# Patient Record
Sex: Female | Born: 1965 | Race: Black or African American | Hispanic: No | Smoking: Never smoker
Health system: Southern US, Community
[De-identification: ages and names within clinical notes are randomized; demographics above are authoritative.]

## PROBLEM LIST (undated history)

## (undated) DIAGNOSIS — E119 Type 2 diabetes mellitus without complications: Secondary | ICD-10-CM

## (undated) DIAGNOSIS — J45909 Unspecified asthma, uncomplicated: Secondary | ICD-10-CM

## (undated) DIAGNOSIS — M797 Fibromyalgia: Secondary | ICD-10-CM

## (undated) DIAGNOSIS — I82403 Acute embolism and thrombosis of unspecified deep veins of lower extremity, bilateral: Secondary | ICD-10-CM

## (undated) DIAGNOSIS — G4730 Sleep apnea, unspecified: Secondary | ICD-10-CM

## (undated) DIAGNOSIS — M1990 Unspecified osteoarthritis, unspecified site: Secondary | ICD-10-CM

## (undated) HISTORY — PX: ABDOMINAL HYSTERECTOMY: SHX81

## (undated) HISTORY — PX: FOOT SURGERY: SHX648

---

## 2005-11-13 ENCOUNTER — Ambulatory Visit: Payer: Self-pay | Admitting: Pulmonary Disease

## 2006-02-26 ENCOUNTER — Ambulatory Visit: Payer: Self-pay | Admitting: Pulmonary Disease

## 2006-03-20 ENCOUNTER — Ambulatory Visit: Payer: Self-pay | Admitting: Pulmonary Disease

## 2007-08-18 ENCOUNTER — Emergency Department (HOSPITAL_COMMUNITY): Admission: EM | Admit: 2007-08-18 | Discharge: 2007-08-18 | Payer: Self-pay | Admitting: Emergency Medicine

## 2008-10-29 ENCOUNTER — Emergency Department (HOSPITAL_COMMUNITY): Admission: EM | Admit: 2008-10-29 | Discharge: 2008-10-30 | Payer: Self-pay | Admitting: Emergency Medicine

## 2008-12-25 ENCOUNTER — Emergency Department (HOSPITAL_COMMUNITY): Admission: EM | Admit: 2008-12-25 | Discharge: 2008-12-25 | Payer: Self-pay | Admitting: Emergency Medicine

## 2009-03-28 ENCOUNTER — Emergency Department (HOSPITAL_COMMUNITY): Admission: EM | Admit: 2009-03-28 | Discharge: 2009-03-28 | Payer: Self-pay | Admitting: Emergency Medicine

## 2009-04-07 ENCOUNTER — Emergency Department (HOSPITAL_COMMUNITY): Admission: EM | Admit: 2009-04-07 | Discharge: 2009-04-07 | Payer: Self-pay | Admitting: Emergency Medicine

## 2010-06-02 LAB — POCT URINALYSIS DIP (DEVICE)
Bilirubin Urine: NEGATIVE
Glucose, UA: NEGATIVE mg/dL
Ketones, ur: NEGATIVE mg/dL
Nitrite: NEGATIVE

## 2010-07-15 NOTE — Letter (Signed)
December 07, 2005    Stephanie Camacho  [address]  [place], [place] [address]   RE:  [name], [name]  MRN:  [id number]  /  DOB:  1966-01-08   Dear [name],   You were scheduled to have a followup appointment at [facility] on  December 07, 2005.  Unfortunately, you were not able to make this visit.  If  you would like for [place] to continue to assist you in the care of your sleep  difficulties as well as your breathing difficulties, please call our office  to reschedule your followup appointment.   Sincerely,      Coralyn Helling, MD    VS/MedQ  /  Job #:  [id number]  DD:  12/07/2005 / DT:  12/09/2005

## 2010-07-15 NOTE — Assessment & Plan Note (Signed)
[facility]                             PULMONARY OFFICE NOTE   Stephanie Camacho, Stephanie Camacho                      MRN:          [id number]  DATE:06/04/2006                            DOB:          1965-12-10    PULMONARY NOTE   I had received the apnea report for Stephanie Camacho, which was done on June 01, 2006, and this showed a respiratory index of 6, which would be  suggestive of some degree of sleep-disordered breathing.   I had discussed these results with Stephanie Camacho, and I discussed with her  that, this, in connection with her symptoms description, still raises  the concern for her having some obstructive sleep apnea.  I have again  suggested that she undergo an overnight polysomnogram to further  evaluate this.  She is agreeable to do this at this time as she has  apparently had a change in her insurance, and hopefully, she will not  have the same kind of financial constraints that was previously  experienced with scheduling her sleep study.  I will then plan on  calling her after review of her sleep study to discuss the results and  any further treatment or recommendations.     Coralyn Helling, MD  Electronically Signed    VS/MedQ  DD: 06/06/2006  DT: 06/06/2006  Job #: [id number]

## 2010-07-15 NOTE — Letter (Signed)
January 08, 2006     RE:  TARAYA, STEWARD  MRN:  [id number]  /  DOB:  1966-02-07   Dear [name],   You were scheduled to have a followup appointment at [facility] on  January 08, 2006 for your history of narcolepsy as well as asthma.  Unfortunately, you were not able to make this appointment. If you would like  [place] to continue assisting you with your sleeping and breathing difficulties,  please call our office to reschedule a followup appointment.   Sincerely,     Coralyn Helling, M.D.    Sincerely,      Coralyn Helling, MD  Electronically Signed    VS/MedQ  DD: 01/08/2006  DT: 01/08/2006  Job #: [id number]

## 2010-07-15 NOTE — Assessment & Plan Note (Signed)
[facility]                             PULMONARY OFFICE NOTE   [name], [name]                      MRN:          [id number]  DATE:03/20/2006                            DOB:          1965/12/07    I saw Stephanie Camacho today in followup for her asthma, narcolepsy, sleep  disturbance, and chronic rhinitis with postnasal drip. She apparently  was having difficulties with migraines and as we saw was not able to  have earlier followup. She says that she had an MRI and was told that  she had a cyst, but this was a benign lesion. She currently complains of  congestion in her sinuses with postnasal drip. She has not been using  her Nasonex on a regular basis and she has not been using the nasal  irrigation. She had undergone pulmonary function test on February 24, 2006 and these showed a post bronchodilator FEV1/FVC ratio of 89%, an  FEV1 of 2.42 L which was 79% predicted, an FVC of 2.73 which was 70%  predicted, total lung capacity was 3.98 which was 70% predicted, and the  DLCO was 56% predicted, but corrected for lung volumes was 156%  predicted, which would be consistent with a mild restrictive defect with  a decrease diffusing capacity which corrected for lung volumes.   PHYSICAL EXAMINATION:  She is 266 pounds, temperature 97.6, blood  pressure 114/66, heart rate is 70, oxygen saturation 90%.  HEENT: There was no sinusitis, she has clear nasal discharge. No oral  lesion. No lymphadenopathy.  HEART: Regular, S1, S2.  CHEST: Clear to auscultation.  ABDOMEN: Obese, soft, nontender.  EXTREMITIES: There was no edema.   IMPRESSION:  1. Sleep disturbance.  While she does carry a diagnosis of narcolepsy,      I am still quite concerned that she may in fact have sleep disorder      breathing as well and therefore I have rescheduled her for an      overnight polysomnogram to further evaluate this.  2. Symptoms of asthma.  I would continue her on her  Pulmicort as well      as albuterol as needed.  3. Chronic rhinitis with postnasal drip. I have advised her to use her      Nasonex on a regular basis. I have also instructed her on the use      of nasal irrigation.  4. Restrictive defect on pulmonary function tests with obesity. I have      discussed with her the importance of diet, exercise, and weight      reduction.   I will follow up with her in approximately 6 to 8 weeks.     Coralyn Helling, MD  Electronically Signed    VS/MedQ  DD: 03/23/2006  DT: 03/23/2006  Job #: [id number]

## 2010-07-15 NOTE — Assessment & Plan Note (Signed)
[facility]                               PULMONARY OFFICE NOTE   LATOYNA, Stephanie Camacho                      MRN:          [id number]  DATE:11/13/2005                            DOB:          Jun 21, 1965    I met Ms. Stephanie Camacho Ridgel today for evaluation of her asthma and narcolepsy.  She  says that she has moved to the [place] area approximately 2 months after  living in [place].  She says she has had difficulty feeling sleepy for  years.  She, apparently, had undergone a sleep study approximately 2 years  ago at [facility] in [place], [place], but she is unsure  what the results of that were.  She continued to have difficulty with her  sleep and had another sleep study done with a Dr. Stevie Kern in [place],  [place], which apparently was unrevealing and then she subsequently  underwent a multiple sleep latency test which apparently showed evidence for  narcolepsy.  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  Unfortunately, I do not have any of these sleep study results available for  my review at this time.   She says that her current sleep pattern is that she will try to go to bed at  about 11:00 o'clock at night, although she is oftentimes falling asleep  before this.  She said that once she is asleep she rarely wakes up except to  may be use the bathroom once during the night and then goes right back to  sleep.  She will wake up between 10:00 and 11:00 o'clock in the morning but  still feel quite tired and also complains of having a headache in the  morning.  She has been told that she snores as well as stops breathing while  she is asleep and she will sometimes wake up with a choking sensation as  well as a dry mouth and feeling sweaty.  She has also been described as a  restless sleeper and occasionally will grind her teeth as well as talk in  her  sleep.  She says that if she tries sleeping on her back, she has  difficulty with her breathing.  There is no history of sleep walking or  restless leg syndrome.  She says that she gets a sensation of sleep  paralysis approximately once a month.  She says that she also has a  sensation that she sees something flashing by her when she is transitioning  from being awake to sleep and this happens quite frequently.  She has had  episodes in which she is laughing very hard and then she feels that her face  muscles get tired.   She is not currently using anything to help her fall asleep at night.  She  was, I believe, recently started on Provigil 200 mg daily, although I am not  sure she is actually taking this at the present time.  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  She currently has  episodes of dyspnea with exertion as well as the chest tightness, coughing,  wheezing, and production of clear sputum.  She had undergone a chest x-ray  and pulmonary function testing when she was in [place] but she could not  recall where or when and she did not recall the results of these tests.   PAST MEDICAL HISTORY:  Otherwise is significant for rheumatoid arthritis,  fibromyalgia, asthma, postnasal drip with chronic sinus disease, chronic  headaches, narcolepsy.   PAST SURGICAL HISTORY:  Significant for a tonsillectomy, cholecystectomy,  partial hysterectomy for an uterine fibroids.  She is status post tubal  ligation.   CURRENT MEDICATIONS:  1. Pulmicort 2 puffs b.i.d.  2. Celebrex 200 mg daily.  3. Albuterol as needed.  4. Provigil 200 mg daily, which, again, I am not sure she is actually      taking.  5. Darvocet p.r.n.   ALLERGIES:  She has no known drug allergies.   FAMILY HISTORY:  Significant for her mother had breast cancer and allergies.  A sister who had T-cell lymphoma  and cervical cancer.  She has a brother who  has asthma and her father had heart disease.   SOCIAL HISTORY:  She recently moved from [place].  She is divorced.  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  She has 4 children.  There is no  history of tobacco or alcohol use.   REVIEW OF SYMPTOMS:  She complains of having frequent sinus congestion with  postnasal drip.  She does feel depressed.  She has episodes of joint  stiffness and swelling in her joints, and she also has frequent headaches.  She has also gained approximately 20 to 25 pounds over the last 1 to 2  years.   PHYSICAL EXAMINATION:  She is 5 feet 6 inches tall.  Weight is 266 pounds.  Temperature 98.3.  Blood pressure 138/86.  Heart rate is 85.  Oxygen  saturation is 100% on room air.  HEENT:  Pupils are reactive.  There is no sinus tenderness.  She has  prominent nasal turbinates.  She has a Mallampati IV airway.  There is no lymphadenopathy.  No thyromegaly.  HEART:  S1, S2 regular rhythm.  CHEST:  Clear to auscultation.  ABDOMEN:  Obese, soft, nontender.  Positive bowel sounds.  EXTREMITIES:  No edema, cyanosis, or clubbing.  NEUROLOGIC:  She is alert and oriented x3; 5/5 strength; and no cerebellar  deficits are appreciated.   IMPRESSION:  1. 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  Given her myriad of      other symptoms, I would also be highly suspicious as he has some degree      of sleep disordered breathing.  What I have asked her to do is to      obtain copies of her previous sleep studies from while she was in      [place] for my review and then, depending upon review of this, I would      consider whether she would actually need to have an additional sleep      study to further evaluate the possibility of sleep apnea.  In the  meantime, I have discussed  with her the importance of maintaining a      fixed sleep-wake schedule as well as scheduling periodic naps during      the day.  Driving precautions were discussed with her as well.  I had      also discussed the importance of diet, exercise, weight reduction as      well as the avoidance of alcohol and sedatives and then if she, indeed,      does have narcolepsy, then I would recommend continuing her on      Provigil.  If she does have episodes of cataplexy, then consideration      could be given to starting her on a selective serotonin reuptake      inhibitor.  If there is still a question with regards to the diagnosis      of narcolepsy, then she could also undergo HLA testing to further      assess the possibility of narcolepsy with cataplexy.  2. Asthma.  At this time, we will continue her on her inhaler regimen of      Pulmicort and albuterol.  I will also make arrangements for her to      undergo pulmonary function testing as well as a chest x-ray and then,      upon review of this, would make any further recommendations with the      primary concern being that if, in fact, she does have a history of      rheumatoid arthritis, then she could actually be having pulmonary      involvement related to her rheumatoid arthritis.  3. Postnasal drip with recurrent sinus infections.  I have discussed with      her the use of nasal irrigation.  I have also given her a sample of      Nasonex, 2 sprays in each nostril once daily in hopes that this would      help alleviate some of her symptoms and this may help to improve her      asthma symptoms as well.  4. Rheumatoid arthritis.  She is due to have followup with Dr. Stacey Drain within the next several weeks.  Again, I would check her chest      x-ray as well as pulmonary function tests to determine if she may have      some degree of involvement from her rheumatoid arthritis in her lungs      as well.  I have made arrangements for  her to have followup with me in      3 to 4 weeks.                                   Coralyn Helling, MD   VS/MedQ  DD:  11/17/2005  DT:  11/20/2005  Job #:  [id number]   cc:   Royetta Crochet, MD  Aundra Dubin, M.D.

## 2012-05-18 ENCOUNTER — Emergency Department: Payer: Self-pay | Admitting: Emergency Medicine

## 2014-07-03 DIAGNOSIS — R7309 Other abnormal glucose: Secondary | ICD-10-CM | POA: Diagnosis not present

## 2014-07-03 DIAGNOSIS — R5383 Other fatigue: Secondary | ICD-10-CM | POA: Diagnosis not present

## 2014-07-03 DIAGNOSIS — M130 Polyarthritis, unspecified: Secondary | ICD-10-CM | POA: Diagnosis not present

## 2014-07-03 DIAGNOSIS — I10 Essential (primary) hypertension: Secondary | ICD-10-CM | POA: Diagnosis not present

## 2014-08-07 DIAGNOSIS — Z1231 Encounter for screening mammogram for malignant neoplasm of breast: Secondary | ICD-10-CM | POA: Diagnosis not present

## 2014-08-21 DIAGNOSIS — M069 Rheumatoid arthritis, unspecified: Secondary | ICD-10-CM | POA: Diagnosis not present

## 2014-08-21 DIAGNOSIS — R5383 Other fatigue: Secondary | ICD-10-CM | POA: Diagnosis not present

## 2014-08-21 DIAGNOSIS — M130 Polyarthritis, unspecified: Secondary | ICD-10-CM | POA: Diagnosis not present

## 2014-08-21 DIAGNOSIS — E0800 Diabetes mellitus due to underlying condition with hyperosmolarity without nonketotic hyperglycemic-hyperosmolar coma (NKHHC): Secondary | ICD-10-CM | POA: Diagnosis not present

## 2014-10-16 DIAGNOSIS — K088 Other specified disorders of teeth and supporting structures: Secondary | ICD-10-CM | POA: Diagnosis not present

## 2014-10-16 DIAGNOSIS — K029 Dental caries, unspecified: Secondary | ICD-10-CM | POA: Diagnosis not present

## 2014-10-16 DIAGNOSIS — K047 Periapical abscess without sinus: Secondary | ICD-10-CM | POA: Diagnosis not present

## 2014-10-16 DIAGNOSIS — R229 Localized swelling, mass and lump, unspecified: Secondary | ICD-10-CM | POA: Diagnosis not present

## 2014-10-25 ENCOUNTER — Ambulatory Visit (HOSPITAL_BASED_OUTPATIENT_CLINIC_OR_DEPARTMENT_OTHER): Payer: 59 | Attending: Family Medicine

## 2014-10-25 VITALS — Ht 66.0 in | Wt 277.0 lb

## 2014-10-25 DIAGNOSIS — R0683 Snoring: Secondary | ICD-10-CM | POA: Diagnosis not present

## 2014-10-25 DIAGNOSIS — G4733 Obstructive sleep apnea (adult) (pediatric): Secondary | ICD-10-CM | POA: Insufficient documentation

## 2014-10-25 DIAGNOSIS — E119 Type 2 diabetes mellitus without complications: Secondary | ICD-10-CM

## 2014-10-31 DIAGNOSIS — E119 Type 2 diabetes mellitus without complications: Secondary | ICD-10-CM | POA: Diagnosis not present

## 2014-10-31 DIAGNOSIS — G4733 Obstructive sleep apnea (adult) (pediatric): Secondary | ICD-10-CM | POA: Diagnosis not present

## 2014-10-31 NOTE — Progress Notes (Signed)
  Patient Name: Stephanie Camacho, Stephanie Camacho Date: 10/25/2014 Gender: Female D.O.B: 1965-06-30 Age (years): 48 Referring Provider: Lucianne Lei Height (inches): 66 Interpreting Physician: Baird Lyons MD, ABSM Weight (lbs): 277 RPSGT: Baxter Flattery BMI: 26 MRN: [id number] Neck Size: 15.00 CLINICAL INFORMATION Sleep Study Type: NPSG     Indication for sleep study: Obesity, OSA, Witnessed Apneas     Epworth Sleepiness Score:  15/24     SLEEP STUDY TECHNIQUE As per the AASM Manual for the Scoring of Sleep and Associated Events v2.3 (April 2016) with a hypopnea requiring 4% desaturations.  The channels recorded and monitored were frontal, central and occipital EEG, electrooculogram (EOG), submentalis EMG (chin), nasal and oral airflow, thoracic and abdominal wall motion, anterior tibialis EMG, snore microphone, electrocardiogram, and pulse oximetry.  MEDICATIONS Patient's medications include: charted for review. Medications self-administered by patient during sleep study : No sleep medicine administered.  SLEEP ARCHITECTURE The study was initiated at 10:18:47 PM and ended at 5:19:20 AM.  Sleep onset time was 10.5 minutes and the sleep efficiency was 95.4%. The total sleep time was 401.0 minutes.  Stage REM latency was 81.5 minutes.  The patient spent 2.24% of the night in stage N1 sleep, 80.55% in stage N2 sleep, 3.74% in stage N3 and 13.46% in REM.  Alpha intrusion was absent.  Supine sleep was 60.65%.  Awake after sleep onset 9.0 minutes  RESPIRATORY PARAMETERS The overall apnea/hypopnea index (AHI) was 6.0 per hour. There were 0 total apneas, including 0 obstructive, 0 central and 0 mixed apneas. There were 40 hypopneas and 0 RERAs.  The AHI during Stage REM sleep was 24.4 per hour.  AHI while supine was 8.6 per hour.  The mean oxygen saturation was 93.27%. The minimum SpO2 during sleep was 84.00%.  Loud snoring was noted during this study.  CARDIAC  DATA The 2 lead EKG demonstrated sinus rhythm. The mean heart rate was 65.94 beats per minute. Other EKG findings include: None.  LEG MOVEMENT DATA The total PLMS were 1 with a resulting PLMS index of 0.15. Associated arousal with leg movement index was 0.0 .  IMPRESSIONS Mild obstructive sleep apnea occurred during this study (AHI = 6.0/h). Therapy based on clinical evaluation. No significant central sleep apnea occurred during this study (CAI = 0.0/h). Mild oxygen desaturation was noted during this study (Min O2 = 84.00%). The patient snored with Loud snoring volume. No cardiac abnormalities were noted during this study. Clinically significant periodic limb movements did not occur during sleep. No significant associated arousals.  Sleep quality on this study night was good, and wouldn't predict the degree of daytime somnolence described. If appropriate, consider a Multiple Sleep Latency Test as a daytime study, off medication, to evaluate for possible primary disorder of excessive sleepiness, such as narcolepsy.  DIAGNOSIS Obstructive Sleep Apnea (327.23 [G47.33 ICD-10])- mild Primary Snoring (786.09 [R06.83 ICD-10])  RECOMMENDATIONS Positional therapy avoiding supine position during sleep. Very mild obstructive sleep apnea. Return to discuss treatment options. Avoid alcohol, sedatives and other CNS depressants that may worsen sleep apnea and disrupt normal sleep architecture. Sleep hygiene should be reviewed to assess factors that may improve sleep quality. Weight management and regular exercise should be initiated or continued if appropriate.  Deneise Lever Diplomate, American Board of Sleep Medicine  ELECTRONICALLY SIGNED ON:  10/31/2014, 2:43 PM [place] PH: [phone]   FX: [phone] [place]

## 2015-02-09 DIAGNOSIS — Z7984 Long term (current) use of oral hypoglycemic drugs: Secondary | ICD-10-CM | POA: Diagnosis not present

## 2015-02-09 DIAGNOSIS — E119 Type 2 diabetes mellitus without complications: Secondary | ICD-10-CM | POA: Diagnosis not present

## 2017-08-20 DIAGNOSIS — M79671 Pain in right foot: Secondary | ICD-10-CM | POA: Diagnosis not present

## 2017-08-20 DIAGNOSIS — M79672 Pain in left foot: Secondary | ICD-10-CM | POA: Diagnosis not present

## 2017-08-20 DIAGNOSIS — M069 Rheumatoid arthritis, unspecified: Secondary | ICD-10-CM | POA: Diagnosis not present

## 2017-08-27 DIAGNOSIS — Z8739 Personal history of other diseases of the musculoskeletal system and connective tissue: Secondary | ICD-10-CM | POA: Diagnosis not present

## 2017-08-27 DIAGNOSIS — M79671 Pain in right foot: Secondary | ICD-10-CM | POA: Insufficient documentation

## 2017-08-27 DIAGNOSIS — M79672 Pain in left foot: Secondary | ICD-10-CM

## 2017-08-27 DIAGNOSIS — M19042 Primary osteoarthritis, left hand: Secondary | ICD-10-CM | POA: Diagnosis not present

## 2017-08-27 DIAGNOSIS — M19041 Primary osteoarthritis, right hand: Secondary | ICD-10-CM | POA: Diagnosis not present

## 2017-09-17 ENCOUNTER — Ambulatory Visit (INDEPENDENT_AMBULATORY_CARE_PROVIDER_SITE_OTHER): Payer: Medicare Other | Admitting: Podiatry

## 2017-09-17 ENCOUNTER — Ambulatory Visit (INDEPENDENT_AMBULATORY_CARE_PROVIDER_SITE_OTHER): Payer: Medicare Other

## 2017-09-17 ENCOUNTER — Encounter: Payer: Self-pay | Admitting: Podiatry

## 2017-09-17 DIAGNOSIS — M722 Plantar fascial fibromatosis: Secondary | ICD-10-CM

## 2017-09-17 MED ORDER — METHYLPREDNISOLONE 4 MG PO TBPK: ORAL_TABLET | ORAL | 0 refills | Status: DC

## 2017-09-17 MED ORDER — MELOXICAM 15 MG PO TABS: 15.0000 mg | ORAL_TABLET | Freq: Every day | ORAL | 3 refills | Status: DC

## 2017-09-17 NOTE — Progress Notes (Signed)
  Subjective:  Patient ID: Stephanie Camacho, female    DOB: December 06, 1965,  MRN: [id number] HPI Chief Complaint  Patient presents with  . Foot Pain    Patient presents today for bilat feet pain x 2-3 months.  She reports the pain is progressivly getting worse.  She states she has shooting pains in her heels and sharp pains on the lateral sides and tops of her feet.  She has been taking Naproxen/Ibuprofen and using biofreeze with minimal relief.  She also states she has problems with ingrown toenails bilat hallux and 2nd toes off and on.  . Nail Problem    52 y.o. female presents with the above complaint.   ROS: Denies fever chills nausea vomiting muscle aches pains calf pain back pain chest pain shortness of breath  No past medical history on file.   Current Outpatient Medications:  .  Cinnamon 500 MG TABS, Take by mouth., Disp: , Rfl:  .  naproxen sodium (ANAPROX) 550 MG tablet, Take 550 mg by mouth 2 (two) times daily with a meal., Disp: , Rfl:  .  meloxicam (MOBIC) 15 MG tablet, Take 1 tablet (15 mg total) by mouth daily., Disp: 30 tablet, Rfl: 3 .  methylPREDNISolone (MEDROL DOSEPAK) 4 MG TBPK tablet, 6 day dose pack - take as directed, Disp: 21 tablet, Rfl: 0  No Known Allergies Review of Systems Objective:  There were no vitals filed for this visit.  General: Well developed, nourished, in no acute distress, alert and oriented x3   Dermatological: Skin is warm, dry and supple bilateral. Nails x 10 are well maintained; remaining integument appears unremarkable at this time. There are no open sores, no preulcerative lesions, no rash or signs of infection present.  Sharp incurvated nail margins to the hallux and second digits bilateral.  No erythema edema cellulitis drainage or odor.  Mildly tender on palpation.  Vascular: Dorsalis Pedis artery and Posterior Tibial artery pedal pulses are 2/4 bilateral with immedate capillary fill time. Pedal hair growth present. No varicosities  and no lower extremity edema present bilateral.   Neruologic: Grossly intact via light touch bilateral. Vibratory intact via tuning fork bilateral. Protective threshold with Semmes Wienstein monofilament intact to all pedal sites bilateral. Patellar and Achilles deep tendon reflexes 2+ bilateral. No Babinski or clonus noted bilateral.   Musculoskeletal: No gross boney pedal deformities bilateral. No pain, crepitus, or limitation noted with foot and ankle range of motion bilateral. Muscular strength 5/5 in all groups tested bilateral.  She has pain on palpation medial calcaneal tubercles bilateral.  Gait: Unassisted, Nonantalgic.    Radiographs:  Radiographs taken today demonstrate mild pes planus no significant acute findings other than soft tissue increase in density at the plantar fashion calcaneal insertion site indicative of plantar fasciitis.  Assessment & Plan:   Assessment: Plantar fasciitis bilateral.  Ingrown nails hallux and second digit bilateral.  Plan: Discussed etiology pathology conservative versus surgical therapies.  At this point started on a Medrol Dosepak to be followed by meloxicam.  Injected the bilateral heels today with 20 mg Kenalog 5 mg Marcaine to the point of maximal tenderness medial aspect of bilateral heel.  She tolerated procedure well without complications.  Placed her in bilateral plantar fascial brace and a bilateral night splint.  Discussed appropriate shoe gear stretching exercise ice therapy and shoe gear modifications.     Tanajah Boulter T. [place], [place]

## 2017-09-17 NOTE — Patient Instructions (Signed)

## 2017-10-15 ENCOUNTER — Ambulatory Visit (INDEPENDENT_AMBULATORY_CARE_PROVIDER_SITE_OTHER): Payer: Medicare Other | Admitting: Podiatry

## 2017-10-15 ENCOUNTER — Encounter: Payer: Self-pay | Admitting: Podiatry

## 2017-10-15 DIAGNOSIS — M722 Plantar fascial fibromatosis: Secondary | ICD-10-CM

## 2017-10-15 DIAGNOSIS — L600 Ingrowing nail: Secondary | ICD-10-CM

## 2017-10-15 MED ORDER — NEOMYCIN-POLYMYXIN-HC 1 % OT SOLN: OTIC | 1 refills | Status: DC

## 2017-10-15 NOTE — Patient Instructions (Signed)

## 2017-10-15 NOTE — Progress Notes (Signed)
She presents today for follow-up of her bilateral plantar fasciitis she states that they are better.  But they are not well.  She states that she has the ingrown toenails to the first and second toes bilaterally.  She would like to have them taken care of today.  Objective: Vital signs are stable alert and oriented x3.  Pulses are palpable.  Neurologic sensorium is intact.  Degenerative flexors are intact.  Muscle strength is 5/5 dorsiflexors plantar flexors inverters everters onto the musculature is intact.  Has pain on palpation medial calcaneal tubercles bilateral heels.  No pain on medial lateral compression of the bilateral heels.  Sharp incurvated nail margin along the tibiofibular border of the hallux bilateral and second digits bilateral.  There is mild erythema no purulence no malodor no drainage.  Tender on palpation.  Assessment: Plantar fasciitis bilateral resolved about 25%.  Ingrown nails hallux and second digit bilateral.  Plan: Discussed etiology pathology and surgical therapies after sterile Betadine skin prep I injected 20 mg Kenalog 5 mg Marcaine medial aspect of the bilateral foot.  After local anesthetic consisting of a 50-50 mixture of Marcaine plain lidocaine plain a total of 3 cc to each toe was injected to the hallux and second digits bilaterally I then performed chemical matrixectomy's to the tibiofibular border of the hallux and second digit bilaterally.  Tolerated procedure well without complications.  She provided with both oral and written home-going instructions as well as a prescription for Cortisporin Otic to be applied twice daily to the toe.  I will follow-up with her in 3 weeks to make sure she is healing well.

## 2017-10-18 ENCOUNTER — Telehealth: Payer: Self-pay | Admitting: Podiatry

## 2017-10-18 MED ORDER — CYCLOBENZAPRINE HCL 10 MG PO TABS: 10.0000 mg | ORAL_TABLET | Freq: Every day | ORAL | 0 refills | Status: DC

## 2017-10-18 NOTE — Telephone Encounter (Addendum)
Per Dr. Milinda Pointer - sent in [place] #[phone] no refills to pt's pharmacy  Attempted to call pt and let her know but voicemail says her mailbox is full.

## 2017-10-18 NOTE — Addendum Note (Signed)
Addended by: Clovis Riley E on: 10/18/2017 02:05 PM   Modules accepted: Orders

## 2017-10-18 NOTE — Telephone Encounter (Signed)
I had a ingrown toenail removed. Having a spasm in toes/feet and legs, really painful. Legs cramping so bad that it wakes me out of my sleep. I was wondering if he could give me something for pain.

## 2017-11-12 ENCOUNTER — Ambulatory Visit: Payer: Medicare Other | Admitting: Podiatry

## 2017-11-14 ENCOUNTER — Encounter: Payer: Self-pay | Admitting: Podiatry

## 2017-11-14 ENCOUNTER — Ambulatory Visit (INDEPENDENT_AMBULATORY_CARE_PROVIDER_SITE_OTHER): Payer: Medicare Other | Admitting: Podiatry

## 2017-11-14 DIAGNOSIS — Z9889 Other specified postprocedural states: Secondary | ICD-10-CM

## 2017-11-14 DIAGNOSIS — L600 Ingrowing nail: Secondary | ICD-10-CM

## 2017-11-14 NOTE — Progress Notes (Signed)
She presents today for follow-up of matrixectomy's hallux bilateral and second digit bilateral.  She states that they are doing great little bit tender but doing very well.  She is also states that her bilateral heels are wonderful she states that she is very happy with them she states they seem to be getting better daily.  Objective: Vital signs are stable she is alert and oriented x3.  Pulses are palpable.  Matrixectomy sites to hallux and second toes bilateral appeared to be healing very nicely there is mild edema no erythema no cellulitis mild serosanguineous drainage no odor.  She has no pain on palpation medial calcaneal tubercles.  Assessment: Well-healing matrixectomy's bilateral.  I also have her healing her plantar fasciitis.  Plan: Encouraged her to soak Epsom salts and warm water every day or every other day until she has completely resolved any of the drainage.  Continue to apply the Band-Aid during the daytime leave it open at bedtime.  I offered her an injection to the bilateral heel she states that continues to improve unless it stops improving she does not feel that she needs an injection at this time.  I will follow-up with her in 1 month at which time we will reevaluate.

## 2017-11-20 DIAGNOSIS — M5412 Radiculopathy, cervical region: Secondary | ICD-10-CM | POA: Diagnosis not present

## 2017-11-20 DIAGNOSIS — J0100 Acute maxillary sinusitis, unspecified: Secondary | ICD-10-CM | POA: Diagnosis not present

## 2017-11-20 DIAGNOSIS — M542 Cervicalgia: Secondary | ICD-10-CM | POA: Diagnosis not present

## 2017-11-27 ENCOUNTER — Other Ambulatory Visit: Payer: Self-pay | Admitting: Physical Medicine and Rehabilitation

## 2017-11-27 DIAGNOSIS — M5030 Other cervical disc degeneration, unspecified cervical region: Secondary | ICD-10-CM | POA: Diagnosis not present

## 2017-11-27 DIAGNOSIS — M5412 Radiculopathy, cervical region: Secondary | ICD-10-CM | POA: Diagnosis not present

## 2017-12-05 DIAGNOSIS — Z8739 Personal history of other diseases of the musculoskeletal system and connective tissue: Secondary | ICD-10-CM | POA: Diagnosis not present

## 2017-12-05 DIAGNOSIS — M722 Plantar fascial fibromatosis: Secondary | ICD-10-CM | POA: Diagnosis not present

## 2017-12-05 DIAGNOSIS — M79672 Pain in left foot: Secondary | ICD-10-CM | POA: Diagnosis not present

## 2017-12-05 DIAGNOSIS — M79671 Pain in right foot: Secondary | ICD-10-CM | POA: Diagnosis not present

## 2017-12-19 ENCOUNTER — Ambulatory Visit
Admission: RE | Admit: 2017-12-19 | Discharge: 2017-12-19 | Disposition: A | Discharge status: Home / Self Care | Payer: Medicare Other | Source: Ambulatory Visit | Attending: Physical Medicine and Rehabilitation | Admitting: Physical Medicine and Rehabilitation

## 2017-12-19 ENCOUNTER — Encounter: Payer: Self-pay | Admitting: Radiology

## 2017-12-19 DIAGNOSIS — M5412 Radiculopathy, cervical region: Secondary | ICD-10-CM | POA: Insufficient documentation

## 2017-12-19 DIAGNOSIS — M4802 Spinal stenosis, cervical region: Secondary | ICD-10-CM | POA: Insufficient documentation

## 2017-12-19 DIAGNOSIS — M50122 Cervical disc disorder at C5-C6 level with radiculopathy: Secondary | ICD-10-CM | POA: Diagnosis not present

## 2017-12-24 ENCOUNTER — Encounter: Payer: Self-pay | Admitting: Podiatry

## 2017-12-24 ENCOUNTER — Ambulatory Visit (INDEPENDENT_AMBULATORY_CARE_PROVIDER_SITE_OTHER): Payer: Medicare Other | Admitting: Podiatry

## 2017-12-24 DIAGNOSIS — M722 Plantar fascial fibromatosis: Secondary | ICD-10-CM

## 2017-12-25 NOTE — Progress Notes (Signed)
She presents today for follow-up of her bilateral heels states that it still hurts some.  She is also follow-up of nail procedures hallux and second digits bilaterally these were performed back on 10/15/2017.  States that they burn sometimes.  Objective: Vital signs are stable she is alert and oriented x3.  Toenails appear to be healing very nicely there is some granulation tissue to the fibular border of the second digit of the left foot.  I placed silver nitrate on this today there was no pain.  There is no purulence no malodor no signs of infection.  She does have pain on palpation medial calcaneal tubercles bilateral.  This is not as severe as it previously noted.  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  Slowly resolving plantar fasciitis bilateral.  Plan: I encouraged her to continue all conservative therapies with her plantar fasciitis plantar fascial brace his night splints anti-inflammatories etc. I also injected her heel today after sterile Betadine skin prep 20 mg Kenalog 5 mg Marcaine point maximal tenderness bilaterally.  Tolerated procedure well without complications.  Follow-up with her in 1 month if necessary.

## 2017-12-26 DIAGNOSIS — M5412 Radiculopathy, cervical region: Secondary | ICD-10-CM | POA: Diagnosis not present

## 2017-12-26 DIAGNOSIS — R7309 Other abnormal glucose: Secondary | ICD-10-CM | POA: Diagnosis not present

## 2017-12-26 DIAGNOSIS — M5030 Other cervical disc degeneration, unspecified cervical region: Secondary | ICD-10-CM | POA: Diagnosis not present

## 2018-01-01 DIAGNOSIS — E0800 Diabetes mellitus due to underlying condition with hyperosmolarity without nonketotic hyperglycemic-hyperosmolar coma (NKHHC): Secondary | ICD-10-CM | POA: Diagnosis not present

## 2018-01-01 DIAGNOSIS — M069 Rheumatoid arthritis, unspecified: Secondary | ICD-10-CM | POA: Diagnosis not present

## 2018-01-01 DIAGNOSIS — G4730 Sleep apnea, unspecified: Secondary | ICD-10-CM | POA: Diagnosis not present

## 2018-01-01 DIAGNOSIS — Z1321 Encounter for screening for nutritional disorder: Secondary | ICD-10-CM | POA: Diagnosis not present

## 2018-01-01 DIAGNOSIS — M130 Polyarthritis, unspecified: Secondary | ICD-10-CM | POA: Diagnosis not present

## 2018-01-01 DIAGNOSIS — R799 Abnormal finding of blood chemistry, unspecified: Secondary | ICD-10-CM | POA: Diagnosis not present

## 2018-01-01 DIAGNOSIS — G4762 Sleep related leg cramps: Secondary | ICD-10-CM | POA: Diagnosis not present

## 2018-01-01 DIAGNOSIS — E785 Hyperlipidemia, unspecified: Secondary | ICD-10-CM | POA: Diagnosis not present

## 2018-01-14 DIAGNOSIS — M069 Rheumatoid arthritis, unspecified: Secondary | ICD-10-CM | POA: Diagnosis not present

## 2018-01-14 DIAGNOSIS — E0800 Diabetes mellitus due to underlying condition with hyperosmolarity without nonketotic hyperglycemic-hyperosmolar coma (NKHHC): Secondary | ICD-10-CM | POA: Diagnosis not present

## 2018-01-30 DIAGNOSIS — J209 Acute bronchitis, unspecified: Secondary | ICD-10-CM | POA: Diagnosis not present

## 2018-01-30 DIAGNOSIS — J029 Acute pharyngitis, unspecified: Secondary | ICD-10-CM | POA: Diagnosis not present

## 2018-01-30 DIAGNOSIS — R6889 Other general symptoms and signs: Secondary | ICD-10-CM | POA: Diagnosis not present

## 2018-02-01 ENCOUNTER — Emergency Department: Payer: Medicare Other

## 2018-02-01 ENCOUNTER — Encounter: Payer: Self-pay | Admitting: Emergency Medicine

## 2018-02-01 DIAGNOSIS — R0602 Shortness of breath: Secondary | ICD-10-CM | POA: Insufficient documentation

## 2018-02-01 DIAGNOSIS — R079 Chest pain, unspecified: Secondary | ICD-10-CM | POA: Insufficient documentation

## 2018-02-01 DIAGNOSIS — Z5321 Procedure and treatment not carried out due to patient leaving prior to being seen by health care provider: Secondary | ICD-10-CM | POA: Diagnosis not present

## 2018-02-01 LAB — BASIC METABOLIC PANEL
Anion gap: 8 (ref 5–15)
BUN: 11 mg/dL (ref 6–20)
CALCIUM: 8.8 mg/dL — AB (ref 8.9–10.3)
CO2: 30 mmol/L (ref 22–32)
CREATININE: 0.99 mg/dL (ref 0.44–1.00)
Chloride: 100 mmol/L (ref 98–111)
GFR calc non Af Amer: >60 mL/min (ref >60)
Glucose, Bld: 161 mg/dL — ABNORMAL HIGH (ref 70–99)
Potassium: 3.9 mmol/L (ref 3.5–5.1)
SODIUM: 138 mmol/L (ref 135–145)

## 2018-02-01 LAB — TROPONIN I: Troponin I: <0.03 ng/mL (ref <0.03)

## 2018-02-01 LAB — CBC
HCT: 43.6 % (ref 36.0–46.0)
Hemoglobin: 13.8 g/dL (ref 12.0–15.0)
MCH: 25.7 pg — ABNORMAL LOW (ref 26.0–34.0)
MCHC: 31.7 g/dL (ref 30.0–36.0)
MCV: 81.2 fL (ref 80.0–100.0)
PLATELETS: 269 10*3/uL (ref 150–400)
RBC: 5.37 MIL/uL — ABNORMAL HIGH (ref 3.87–5.11)
RDW: 13.2 % (ref 11.5–15.5)
WBC: 5.9 10*3/uL (ref 4.0–10.5)
nRBC: 0 % (ref 0.0–0.2)

## 2018-02-01 NOTE — ED Triage Notes (Signed)
Patient states that she was diagnosed with the flu on Wednesday. Patient states that about 20 minutes ago she felt a "thump" on the left side of her chest and has been short of breath since.

## 2018-02-02 ENCOUNTER — Encounter: Payer: Self-pay | Admitting: Emergency Medicine

## 2018-02-02 ENCOUNTER — Other Ambulatory Visit: Payer: Self-pay

## 2018-02-02 ENCOUNTER — Emergency Department
Admission: EM | Admit: 2018-02-02 | Discharge: 2018-02-02 | Disposition: A | Discharge status: Home / Self Care | Payer: Medicare Other | Source: Home / Self Care | Attending: Emergency Medicine | Admitting: Emergency Medicine

## 2018-02-02 ENCOUNTER — Emergency Department
Admission: EM | Admit: 2018-02-02 | Discharge: 2018-02-02 | Disposition: A | Discharge status: Home / Self Care | Payer: Medicare Other | Attending: Emergency Medicine | Admitting: Emergency Medicine

## 2018-02-02 DIAGNOSIS — J111 Influenza due to unidentified influenza virus with other respiratory manifestations: Secondary | ICD-10-CM

## 2018-02-02 DIAGNOSIS — J189 Pneumonia, unspecified organism: Secondary | ICD-10-CM

## 2018-02-02 DIAGNOSIS — E119 Type 2 diabetes mellitus without complications: Secondary | ICD-10-CM | POA: Insufficient documentation

## 2018-02-02 DIAGNOSIS — R079 Chest pain, unspecified: Secondary | ICD-10-CM | POA: Diagnosis not present

## 2018-02-02 DIAGNOSIS — J181 Lobar pneumonia, unspecified organism: Principal | ICD-10-CM

## 2018-02-02 DIAGNOSIS — J101 Influenza due to other identified influenza virus with other respiratory manifestations: Secondary | ICD-10-CM | POA: Insufficient documentation

## 2018-02-02 DIAGNOSIS — Z79899 Other long term (current) drug therapy: Secondary | ICD-10-CM | POA: Insufficient documentation

## 2018-02-02 HISTORY — DX: Fibromyalgia: M79.7

## 2018-02-02 HISTORY — DX: Unspecified osteoarthritis, unspecified site: M19.90

## 2018-02-02 HISTORY — DX: Unspecified asthma, uncomplicated: J45.909

## 2018-02-02 HISTORY — DX: Type 2 diabetes mellitus without complications: E11.9

## 2018-02-02 LAB — GLUCOSE, CAPILLARY: Glucose-Capillary: 170 mg/dL — ABNORMAL HIGH (ref 70–99)

## 2018-02-02 MED ORDER — LEVOFLOXACIN 750 MG PO TABS: 750.0000 mg | ORAL_TABLET | Freq: Two times a day (BID) | ORAL | 0 refills | Status: AC

## 2018-02-02 MED ORDER — CEFTRIAXONE SODIUM 1 G IJ SOLR
1.0000 g | Freq: Once | INTRAMUSCULAR | Status: AC
  Administered 2018-02-02: 1 g via INTRAMUSCULAR
  Filled 2018-02-02: qty 10

## 2018-02-02 MED ORDER — LEVOFLOXACIN 750 MG PO TABS
750.0000 mg | ORAL_TABLET | Freq: Once | ORAL | Status: AC
  Administered 2018-02-02: 750 mg via ORAL
  Filled 2018-02-02: qty 1

## 2018-02-02 MED ORDER — LIDOCAINE HCL (PF) 1 % IJ SOLN
2.1000 mL | Freq: Once | INTRAMUSCULAR | Status: AC
  Administered 2018-02-02: 2.1 mL
  Filled 2018-02-02: qty 5

## 2018-02-02 NOTE — ED Provider Notes (Signed)
[facility] Emergency Department Provider Note  ____________________________________________  Time seen: Approximately 3:29 PM  I have reviewed the triage vital signs and the nursing notes.   HISTORY  Chief Complaint Cough    HPI Stephanie Camacho is a 52 y.o. female who presents the emergency department complaining of shortness of breath and cough.  Patient presented to the emergency department last night with similar complaints, had labs, chest x-ray, EKG but left without being seen.  Patient reports that she was diagnosed with flu 5 days ago, was placed on azithromycin and Tamiflu for symptoms.  Patient states that she did not have a chest x-ray at that time but did have influenza swab which was positive.  Patient reports that she has had increasing shortness of breath, cough.  No fevers or chills are reported at this time.  Patient denies any headache, neck pain, chest pain, abdominal pain, nausea or vomiting at this time.  Patient is a diabetic, type II but states that she has not been taking her medications as she is "just felt so bad I did not want to take them."  Patient does not check her blood sugar on a routine basis.  Patient has a history of asthma, diabetes, fibromyalgia, arthritis.    Past Medical History:  Diagnosis Date  . Arthritis   . Asthma   . Diabetes mellitus without complication ([place])   . Fibromyalgia     Patient Active Problem List   Diagnosis Date Noted  . History of rheumatoid arthritis 08/27/2017  . Pain in both feet 08/27/2017    Past Surgical History:  Procedure Laterality Date  . ABDOMINAL HYSTERECTOMY    . FOOT SURGERY      Prior to Admission medications   Medication Sig Start Date End Date Taking? Authorizing Provider  Cinnamon 500 MG TABS Take by mouth.    [provider]  cyclobenzaprine (FLEXERIL) 10 MG tablet Take 1 tablet (10 mg total) by mouth at bedtime. 10/18/17   Hyatt, Max T, DPM  fluticasone  (FLONASE) 50 MCG/ACT nasal spray SHAKE LQ AND U 1 SPR IEN BID 11/20/17   [provider]  gabapentin (NEURONTIN) 300 MG capsule TK 1 C PO QHS 11/27/17   [provider]  levofloxacin (LEVAQUIN) 750 MG tablet Take 1 tablet (750 mg total) by mouth 2 (two) times daily for 5 days. 02/02/18 02/07/18  Rudell Ortman, Charline Bills, PA-C  meloxicam (MOBIC) 15 MG tablet Take 1 tablet (15 mg total) by mouth daily. 09/17/17   Hyatt, Max T, DPM  naproxen sodium (ANAPROX) 550 MG tablet Take 550 mg by mouth 2 (two) times daily with a meal.    [provider]  NEOMYCIN-POLYMYXIN-HYDROCORTISONE (CORTISPORIN) 1 % SOLN OTIC solution Apply 1-2 drops to toe BID after soaking 10/15/17   Hyatt, Max T, DPM    Allergies Patient has no known allergies.  No family history on file.  Social History Social History   Tobacco Use  . Smoking status: Never Smoker  . Smokeless tobacco: Never Used  Substance Use Topics  . Alcohol use: Not Currently  . Drug use: Never     Review of Systems  Constitutional: Currently denies fever/chills Eyes: No visual changes. No discharge ENT: No upper respiratory complaints. Cardiovascular: no chest pain. Respiratory: Positive cough.  Positive SOB. Gastrointestinal: No abdominal pain.  No nausea, no vomiting.  No diarrhea.  No constipation. Genitourinary: Negative for dysuria. No hematuria Musculoskeletal: Negative for musculoskeletal pain. Skin: Negative for rash, abrasions, lacerations, ecchymosis.  Neurological: Negative for headaches, focal weakness or numbness. 10-point ROS otherwise negative.  ____________________________________________   PHYSICAL EXAM:  VITAL SIGNS: ED Triage Vitals  Enc Vitals Group     BP 02/02/18 1451 124/79     Pulse Rate 02/02/18 1451 82     Resp 02/02/18 1451 18     Temp 02/02/18 1451 98.1 F (36.7 C)     Temp Source 02/02/18 1451 Oral     SpO2 02/02/18 1451 95 %     Weight 02/02/18 1452 263 lb (119.3 kg)     Height  02/02/18 1452 5\' 6"  (1.676 m)     Head Circumference --      Peak Flow --      Pain Score 02/02/18 1451 6     Pain Loc --      Pain Edu? --      Excl. in [place]? --      Constitutional: Alert and oriented. Well appearing and in no acute distress. Eyes: Conjunctivae are normal. PERRL. EOMI. Head: Atraumatic. ENT:      Ears: EACs and TMs unremarkable bilaterally.      Nose: Mild clear congestion/rhinnorhea.      Mouth/Throat: Mucous membranes are moist.  Oropharynx is nonerythematous and nonedematous.  Uvula is midline. Neck: No stridor.  Neck supple full range of motion Hematological/Lymphatic/Immunilogical: Scattered, mobile, nontender anterior cervical lymphadenopathy. Cardiovascular: Normal rate, regular rhythm. Normal S1 and S2.  Good peripheral circulation. Respiratory: Normal respiratory effort without tachypnea or retractions. Lungs with a few crackles and mild rales to the left lower lung field.  Otherwise, lung fields are clear with no wheezing, rales or rhonchi.  Good air entry to the bases with no decreased or absent breath sounds. Musculoskeletal: Full range of motion to all extremities. No gross deformities appreciated. Neurologic:  Normal speech and language. No gross focal neurologic deficits are appreciated.  Skin:  Skin is warm, dry and intact. No rash noted. Psychiatric: Mood and affect are normal. Speech and behavior are normal. Patient exhibits appropriate insight and judgement.   ____________________________________________   LABS (all labs ordered are listed, but only abnormal results are displayed)  Labs Reviewed  GLUCOSE, CAPILLARY - Abnormal; Notable for the following components:      Result Value   Glucose-Capillary 170 (*)    All other components within normal limits  CBG MONITORING, ED   ____________________________________________  EKG   ____________________________________________  RADIOLOGY I personally viewed and evaluated these images from  last night as part of my medical decision making, as well as reviewing the written report by the radiologist.  I concur with radiologist finding of patchy opacity in the left lower lung field.  This correlates with physical exam.  Dg Chest 2 View  Result Date: 02/01/2018 CLINICAL DATA:  Shortness of breath.  Recent diagnosis of flu. EXAM: CHEST - 2 VIEW COMPARISON:  None. FINDINGS: Patchy LEFT lung base airspace opacity. No pleural effusion. Cardiomediastinal silhouette is normal. No pneumothorax. Soft tissue planes and included osseous structures are normal. Surgical clips in the included right abdomen compatible with cholecystectomy. IMPRESSION: LEFT lung base atelectasis versus pneumonia. Electronically Signed   By: Elon Alas M.D.   On: 02/01/2018 22:47    ____________________________________________    PROCEDURES  Procedure(s) performed:    Procedures    Medications  cefTRIAXone (ROCEPHIN) injection 1 g (has no administration in time range)  levofloxacin (LEVAQUIN) tablet 750 mg (has no administration in time range)  lidocaine (PF) (XYLOCAINE) 1 % injection 2.1  mL (has no administration in time range)     ____________________________________________   INITIAL IMPRESSION / ASSESSMENT AND PLAN / ED COURSE  Pertinent labs & imaging results that were available during my care of the patient were reviewed by me and considered in my medical decision making (see chart for details).  Review of the [place] CSRS was performed in accordance of the [place] prior to dispensing any controlled drugs.      Patient's diagnosis is consistent with influenza, left lower lobe pneumonia.  Patient presents the emergency department with a known diagnosis of influenza.  Patient has had increased cough, shortness of breath over the past several days.  Patient was in this department last night, had labs, chest x-ray last night.  Patient left without being seen.  She returns today with similar symptoms.   Last night, patient had chest pain but denies any currently.  Patient reports shortness of breath with coughing but no frank difficulty breathing.  Patient is a diabetic and has not taken any of her meds or checked her blood sugar in the last 5 days.  Chest x-ray is concerning for left lower quadrant pneumonia which is correlated with adventitious lung sounds in the left lower lung field.  Given this finding, patient will be given Rocephin, Levaquin for pneumonia.  Capillary blood glucose reading 170 mg/dL.  Patient with no concerning physical exam or symptoms concerning for diabetic complication.  Patient is instructed to start her diabetic medications again at home.  Follow-up primary care as needed. Patient is given ED precautions to return to the ED for any worsening or new symptoms.     ____________________________________________  FINAL CLINICAL IMPRESSION(S) / ED DIAGNOSES  Final diagnoses:  Community acquired pneumonia of left lower lobe of lung ([place])  Influenza      NEW MEDICATIONS STARTED DURING THIS VISIT:  ED Discharge Orders         Ordered    levofloxacin (LEVAQUIN) 750 MG tablet  2 times daily     02/02/18 1709              This chart was dictated using voice recognition software/Dragon. Despite best efforts to proofread, errors can occur which can change the meaning. Any change was purely unintentional.    Darletta Moll, PA-C 02/02/18 1710    Earleen Newport, MD 02/02/18 [phone]

## 2018-02-02 NOTE — ED Notes (Signed)
NAD noted at time of D/C. Pt denies questions or concerns. Pt ambulatory to the lobby at this time.  

## 2018-02-02 NOTE — ED Triage Notes (Signed)
Cough x 6 days, diagnosed with flu, no resp distress.

## 2018-02-02 NOTE — ED Notes (Signed)
Pt c/o cough since Sunday night. Pt with noted nasal congestion and dry cough at time of assessment.

## 2018-02-04 ENCOUNTER — Ambulatory Visit: Payer: Medicare Other | Admitting: Podiatry

## 2018-02-06 DIAGNOSIS — J189 Pneumonia, unspecified organism: Secondary | ICD-10-CM | POA: Diagnosis not present

## 2018-02-25 DIAGNOSIS — E0800 Diabetes mellitus due to underlying condition with hyperosmolarity without nonketotic hyperglycemic-hyperosmolar coma (NKHHC): Secondary | ICD-10-CM | POA: Diagnosis not present

## 2018-02-25 DIAGNOSIS — M069 Rheumatoid arthritis, unspecified: Secondary | ICD-10-CM | POA: Diagnosis not present

## 2018-02-25 DIAGNOSIS — M130 Polyarthritis, unspecified: Secondary | ICD-10-CM | POA: Diagnosis not present

## 2018-04-08 DIAGNOSIS — M25562 Pain in left knee: Secondary | ICD-10-CM | POA: Diagnosis not present

## 2018-04-08 DIAGNOSIS — E559 Vitamin D deficiency, unspecified: Secondary | ICD-10-CM | POA: Diagnosis not present

## 2018-04-08 DIAGNOSIS — E0800 Diabetes mellitus due to underlying condition with hyperosmolarity without nonketotic hyperglycemic-hyperosmolar coma (NKHHC): Secondary | ICD-10-CM | POA: Diagnosis not present

## 2018-04-08 DIAGNOSIS — E785 Hyperlipidemia, unspecified: Secondary | ICD-10-CM | POA: Diagnosis not present

## 2018-09-11 DIAGNOSIS — G4730 Sleep apnea, unspecified: Secondary | ICD-10-CM | POA: Diagnosis not present

## 2018-09-11 DIAGNOSIS — E559 Vitamin D deficiency, unspecified: Secondary | ICD-10-CM | POA: Diagnosis not present

## 2018-09-11 DIAGNOSIS — M069 Rheumatoid arthritis, unspecified: Secondary | ICD-10-CM | POA: Diagnosis not present

## 2018-09-11 DIAGNOSIS — E0800 Diabetes mellitus due to underlying condition with hyperosmolarity without nonketotic hyperglycemic-hyperosmolar coma (NKHHC): Secondary | ICD-10-CM | POA: Diagnosis not present

## 2018-09-11 DIAGNOSIS — G47419 Narcolepsy without cataplexy: Secondary | ICD-10-CM | POA: Diagnosis not present

## 2018-09-11 DIAGNOSIS — E785 Hyperlipidemia, unspecified: Secondary | ICD-10-CM | POA: Diagnosis not present

## 2019-01-13 DIAGNOSIS — E559 Vitamin D deficiency, unspecified: Secondary | ICD-10-CM | POA: Diagnosis not present

## 2019-01-13 DIAGNOSIS — E1169 Type 2 diabetes mellitus with other specified complication: Secondary | ICD-10-CM | POA: Diagnosis not present

## 2019-01-13 DIAGNOSIS — E785 Hyperlipidemia, unspecified: Secondary | ICD-10-CM | POA: Diagnosis not present

## 2019-01-13 DIAGNOSIS — Z7189 Other specified counseling: Secondary | ICD-10-CM | POA: Diagnosis not present

## 2019-01-30 ENCOUNTER — Other Ambulatory Visit: Payer: Self-pay | Admitting: Family Medicine

## 2019-01-30 DIAGNOSIS — Z1231 Encounter for screening mammogram for malignant neoplasm of breast: Secondary | ICD-10-CM

## 2019-03-31 ENCOUNTER — Ambulatory Visit: Payer: Medicare Other

## 2019-04-07 DIAGNOSIS — Z1231 Encounter for screening mammogram for malignant neoplasm of breast: Secondary | ICD-10-CM | POA: Diagnosis not present

## 2019-04-14 DIAGNOSIS — M130 Polyarthritis, unspecified: Secondary | ICD-10-CM | POA: Diagnosis not present

## 2019-04-14 DIAGNOSIS — E1169 Type 2 diabetes mellitus with other specified complication: Secondary | ICD-10-CM | POA: Diagnosis not present

## 2019-04-14 DIAGNOSIS — M069 Rheumatoid arthritis, unspecified: Secondary | ICD-10-CM | POA: Diagnosis not present

## 2019-04-23 DIAGNOSIS — R922 Inconclusive mammogram: Secondary | ICD-10-CM | POA: Diagnosis not present

## 2019-04-23 DIAGNOSIS — N6489 Other specified disorders of breast: Secondary | ICD-10-CM | POA: Diagnosis not present

## 2019-04-23 DIAGNOSIS — R921 Mammographic calcification found on diagnostic imaging of breast: Secondary | ICD-10-CM | POA: Diagnosis not present

## 2019-05-05 ENCOUNTER — Ambulatory Visit: Payer: Medicare Other

## 2019-06-23 ENCOUNTER — Encounter: Payer: Self-pay | Admitting: Podiatry

## 2019-06-23 ENCOUNTER — Ambulatory Visit (INDEPENDENT_AMBULATORY_CARE_PROVIDER_SITE_OTHER): Payer: Medicare Other | Admitting: Podiatry

## 2019-06-23 ENCOUNTER — Other Ambulatory Visit: Payer: Self-pay

## 2019-06-23 ENCOUNTER — Ambulatory Visit (INDEPENDENT_AMBULATORY_CARE_PROVIDER_SITE_OTHER): Payer: Medicare Other

## 2019-06-23 DIAGNOSIS — M722 Plantar fascial fibromatosis: Secondary | ICD-10-CM

## 2019-06-23 MED ORDER — MELOXICAM 15 MG PO TABS: 15.0000 mg | ORAL_TABLET | Freq: Every day | ORAL | 3 refills | Status: DC

## 2019-06-23 NOTE — Progress Notes (Signed)
She presents today after having not been here for a couple of years with a chief complaint of pain to the posterior and plantar lateral and plantar medial aspects of the bilateral heels left greater than right.  She states is been aching and painful for several months it had gotten much better has has gotten worse recently.  Morning pain is horrible.  She states that she developed diabetes from all of the steroid injections in her neck back foot and all of the steroids that she took for the same including bronchitis and arthritis.  Objective: Vital signs are stable she alert oriented x3.  Pulses are strong palpable neurologic sensorium is intact deep tendon reflexes are intact muscle strength is normal symmetrical bilateral.  She has pain on palpation medial calcaneal tubercles bilateral.  Assessment: Pain in limb secondary to plantar fasciitis.  Diabetes mellitus noncomplicated.  Plan: Injected bilateral heels today instructed her to use the plantar fascial braces that she has at home I will follow-up with her for diabetic shoe molding with Liliane Channel as well as with me in 1 month.

## 2019-06-27 DIAGNOSIS — M069 Rheumatoid arthritis, unspecified: Secondary | ICD-10-CM | POA: Diagnosis not present

## 2019-06-27 DIAGNOSIS — M130 Polyarthritis, unspecified: Secondary | ICD-10-CM | POA: Diagnosis not present

## 2019-06-27 DIAGNOSIS — E1169 Type 2 diabetes mellitus with other specified complication: Secondary | ICD-10-CM | POA: Diagnosis not present

## 2019-06-27 DIAGNOSIS — E785 Hyperlipidemia, unspecified: Secondary | ICD-10-CM | POA: Diagnosis not present

## 2019-07-30 ENCOUNTER — Other Ambulatory Visit: Payer: Medicare Other | Admitting: Orthotics

## 2019-08-13 ENCOUNTER — Other Ambulatory Visit: Payer: Medicare Other | Admitting: Orthotics

## 2019-08-13 ENCOUNTER — Encounter: Payer: Medicare Other | Admitting: Podiatry

## 2019-09-17 ENCOUNTER — Encounter: Payer: Self-pay | Admitting: Podiatry

## 2019-09-17 ENCOUNTER — Ambulatory Visit (INDEPENDENT_AMBULATORY_CARE_PROVIDER_SITE_OTHER): Payer: Medicare Other | Admitting: Orthotics

## 2019-09-17 ENCOUNTER — Other Ambulatory Visit: Payer: Self-pay

## 2019-09-17 ENCOUNTER — Ambulatory Visit (INDEPENDENT_AMBULATORY_CARE_PROVIDER_SITE_OTHER): Payer: Medicare Other | Admitting: Podiatry

## 2019-09-17 DIAGNOSIS — M722 Plantar fascial fibromatosis: Secondary | ICD-10-CM

## 2019-09-17 DIAGNOSIS — L600 Ingrowing nail: Secondary | ICD-10-CM

## 2019-09-17 MED ORDER — MELOXICAM 15 MG PO TABS
15.0000 mg | ORAL_TABLET | Freq: Every day | ORAL | 3 refills | Status: DC
Start: 2019-09-17 — End: 2023-07-26

## 2019-09-17 NOTE — Progress Notes (Signed)
Cast her for f/o to address PF; plan on device with neutral heel, 3* FF valgus wedge, long arch support.

## 2019-09-17 NOTE — Progress Notes (Signed)
She presents today for follow-up of her bilateral heels.  States that they have been bothering me again.  Objective: Vital signs are stable alert and oriented x3.  Pulses are palpable.  She has pain on palpation medial calcaneal tubercles.  No erythema edema cellulitis drainage or odor.  Assessment: Chronic intractable plantar fasciitis bilateral.  Plan: This point went ahead and injected the bilateral heels today 20 mg Kenalog 5 mg Marcaine point maximal tenderness.  Provided her with new plantar fascial braces bilaterally and she is already today for orthotics.

## 2019-09-24 ENCOUNTER — Encounter: Payer: Medicare Other | Admitting: Podiatry

## 2019-10-08 ENCOUNTER — Ambulatory Visit: Payer: Medicare Other | Admitting: Orthotics

## 2019-10-10 DIAGNOSIS — R079 Chest pain, unspecified: Secondary | ICD-10-CM | POA: Diagnosis not present

## 2019-10-10 DIAGNOSIS — R05 Cough: Secondary | ICD-10-CM | POA: Diagnosis not present

## 2019-10-10 DIAGNOSIS — R000 Tachycardia, unspecified: Secondary | ICD-10-CM | POA: Diagnosis not present

## 2019-10-10 DIAGNOSIS — U071 COVID-19: Secondary | ICD-10-CM | POA: Diagnosis not present

## 2019-10-10 DIAGNOSIS — R509 Fever, unspecified: Secondary | ICD-10-CM | POA: Diagnosis not present

## 2019-10-10 DIAGNOSIS — R0602 Shortness of breath: Secondary | ICD-10-CM | POA: Diagnosis not present

## 2019-10-11 DIAGNOSIS — I82503 Chronic embolism and thrombosis of unspecified deep veins of lower extremity, bilateral: Secondary | ICD-10-CM | POA: Diagnosis not present

## 2019-10-11 DIAGNOSIS — I82492 Acute embolism and thrombosis of other specified deep vein of left lower extremity: Secondary | ICD-10-CM | POA: Diagnosis not present

## 2019-10-11 DIAGNOSIS — Z8616 Personal history of COVID-19: Secondary | ICD-10-CM | POA: Diagnosis not present

## 2019-10-11 DIAGNOSIS — J9601 Acute respiratory failure with hypoxia: Secondary | ICD-10-CM | POA: Diagnosis not present

## 2019-10-11 DIAGNOSIS — I82412 Acute embolism and thrombosis of left femoral vein: Secondary | ICD-10-CM | POA: Diagnosis not present

## 2019-10-11 DIAGNOSIS — R000 Tachycardia, unspecified: Secondary | ICD-10-CM | POA: Diagnosis not present

## 2019-10-11 DIAGNOSIS — J45909 Unspecified asthma, uncomplicated: Secondary | ICD-10-CM | POA: Diagnosis not present

## 2019-10-11 DIAGNOSIS — R0603 Acute respiratory distress: Secondary | ICD-10-CM | POA: Diagnosis not present

## 2019-10-11 DIAGNOSIS — E119 Type 2 diabetes mellitus without complications: Secondary | ICD-10-CM | POA: Diagnosis not present

## 2019-10-11 DIAGNOSIS — U071 COVID-19: Secondary | ICD-10-CM | POA: Diagnosis not present

## 2019-10-11 DIAGNOSIS — I517 Cardiomegaly: Secondary | ICD-10-CM | POA: Diagnosis not present

## 2019-10-11 DIAGNOSIS — I82402 Acute embolism and thrombosis of unspecified deep veins of left lower extremity: Secondary | ICD-10-CM | POA: Diagnosis not present

## 2019-10-11 DIAGNOSIS — Z794 Long term (current) use of insulin: Secondary | ICD-10-CM | POA: Diagnosis not present

## 2019-10-11 DIAGNOSIS — J1282 Pneumonia due to coronavirus disease 2019: Secondary | ICD-10-CM | POA: Diagnosis not present

## 2019-10-11 DIAGNOSIS — M797 Fibromyalgia: Secondary | ICD-10-CM | POA: Diagnosis not present

## 2019-10-11 DIAGNOSIS — D509 Iron deficiency anemia, unspecified: Secondary | ICD-10-CM | POA: Diagnosis not present

## 2019-10-11 DIAGNOSIS — I82432 Acute embolism and thrombosis of left popliteal vein: Secondary | ICD-10-CM | POA: Diagnosis not present

## 2019-10-11 DIAGNOSIS — R0902 Hypoxemia: Secondary | ICD-10-CM | POA: Diagnosis not present

## 2019-10-13 DIAGNOSIS — Z8616 Personal history of COVID-19: Secondary | ICD-10-CM

## 2019-10-13 HISTORY — DX: Personal history of COVID-19: Z86.16

## 2019-10-18 DIAGNOSIS — U071 COVID-19: Secondary | ICD-10-CM | POA: Diagnosis not present

## 2019-10-18 DIAGNOSIS — I8290 Acute embolism and thrombosis of unspecified vein: Secondary | ICD-10-CM | POA: Diagnosis not present

## 2019-10-18 DIAGNOSIS — E119 Type 2 diabetes mellitus without complications: Secondary | ICD-10-CM | POA: Diagnosis not present

## 2019-10-18 DIAGNOSIS — J1282 Pneumonia due to coronavirus disease 2019: Secondary | ICD-10-CM | POA: Diagnosis not present

## 2019-10-18 DIAGNOSIS — J9601 Acute respiratory failure with hypoxia: Secondary | ICD-10-CM | POA: Diagnosis not present

## 2019-10-20 DIAGNOSIS — J45909 Unspecified asthma, uncomplicated: Secondary | ICD-10-CM | POA: Diagnosis not present

## 2019-10-20 DIAGNOSIS — J9601 Acute respiratory failure with hypoxia: Secondary | ICD-10-CM | POA: Diagnosis not present

## 2019-10-20 DIAGNOSIS — Z791 Long term (current) use of non-steroidal anti-inflammatories (NSAID): Secondary | ICD-10-CM | POA: Diagnosis not present

## 2019-10-20 DIAGNOSIS — E119 Type 2 diabetes mellitus without complications: Secondary | ICD-10-CM | POA: Diagnosis not present

## 2019-10-20 DIAGNOSIS — Z7952 Long term (current) use of systemic steroids: Secondary | ICD-10-CM | POA: Diagnosis not present

## 2019-10-20 DIAGNOSIS — J1282 Pneumonia due to coronavirus disease 2019: Secondary | ICD-10-CM | POA: Diagnosis not present

## 2019-10-20 DIAGNOSIS — I82412 Acute embolism and thrombosis of left femoral vein: Secondary | ICD-10-CM | POA: Diagnosis not present

## 2019-10-20 DIAGNOSIS — U071 COVID-19: Secondary | ICD-10-CM | POA: Diagnosis not present

## 2019-10-20 DIAGNOSIS — Z794 Long term (current) use of insulin: Secondary | ICD-10-CM | POA: Diagnosis not present

## 2019-10-20 DIAGNOSIS — M797 Fibromyalgia: Secondary | ICD-10-CM | POA: Diagnosis not present

## 2019-10-20 DIAGNOSIS — Z7901 Long term (current) use of anticoagulants: Secondary | ICD-10-CM | POA: Diagnosis not present

## 2019-10-21 DIAGNOSIS — Z20822 Contact with and (suspected) exposure to covid-19: Secondary | ICD-10-CM | POA: Diagnosis not present

## 2019-10-21 DIAGNOSIS — U071 COVID-19: Secondary | ICD-10-CM | POA: Diagnosis not present

## 2019-10-22 DIAGNOSIS — I82412 Acute embolism and thrombosis of left femoral vein: Secondary | ICD-10-CM | POA: Diagnosis not present

## 2019-10-22 DIAGNOSIS — J45909 Unspecified asthma, uncomplicated: Secondary | ICD-10-CM | POA: Diagnosis not present

## 2019-10-22 DIAGNOSIS — M797 Fibromyalgia: Secondary | ICD-10-CM | POA: Diagnosis not present

## 2019-10-22 DIAGNOSIS — Z791 Long term (current) use of non-steroidal anti-inflammatories (NSAID): Secondary | ICD-10-CM | POA: Diagnosis not present

## 2019-10-22 DIAGNOSIS — E119 Type 2 diabetes mellitus without complications: Secondary | ICD-10-CM | POA: Diagnosis not present

## 2019-10-22 DIAGNOSIS — J9601 Acute respiratory failure with hypoxia: Secondary | ICD-10-CM | POA: Diagnosis not present

## 2019-10-22 DIAGNOSIS — Z794 Long term (current) use of insulin: Secondary | ICD-10-CM | POA: Diagnosis not present

## 2019-10-22 DIAGNOSIS — U071 COVID-19: Secondary | ICD-10-CM | POA: Diagnosis not present

## 2019-10-22 DIAGNOSIS — J1282 Pneumonia due to coronavirus disease 2019: Secondary | ICD-10-CM | POA: Diagnosis not present

## 2019-10-22 DIAGNOSIS — Z7952 Long term (current) use of systemic steroids: Secondary | ICD-10-CM | POA: Diagnosis not present

## 2019-10-22 DIAGNOSIS — Z7901 Long term (current) use of anticoagulants: Secondary | ICD-10-CM | POA: Diagnosis not present

## 2019-10-24 DIAGNOSIS — E119 Type 2 diabetes mellitus without complications: Secondary | ICD-10-CM | POA: Diagnosis not present

## 2019-10-24 DIAGNOSIS — M797 Fibromyalgia: Secondary | ICD-10-CM | POA: Diagnosis not present

## 2019-10-24 DIAGNOSIS — U071 COVID-19: Secondary | ICD-10-CM | POA: Diagnosis not present

## 2019-10-24 DIAGNOSIS — J9601 Acute respiratory failure with hypoxia: Secondary | ICD-10-CM | POA: Diagnosis not present

## 2019-10-24 DIAGNOSIS — Z794 Long term (current) use of insulin: Secondary | ICD-10-CM | POA: Diagnosis not present

## 2019-10-24 DIAGNOSIS — J45909 Unspecified asthma, uncomplicated: Secondary | ICD-10-CM | POA: Diagnosis not present

## 2019-10-24 DIAGNOSIS — Z791 Long term (current) use of non-steroidal anti-inflammatories (NSAID): Secondary | ICD-10-CM | POA: Diagnosis not present

## 2019-10-24 DIAGNOSIS — Z7901 Long term (current) use of anticoagulants: Secondary | ICD-10-CM | POA: Diagnosis not present

## 2019-10-24 DIAGNOSIS — J1282 Pneumonia due to coronavirus disease 2019: Secondary | ICD-10-CM | POA: Diagnosis not present

## 2019-10-24 DIAGNOSIS — Z7952 Long term (current) use of systemic steroids: Secondary | ICD-10-CM | POA: Diagnosis not present

## 2019-10-24 DIAGNOSIS — I82412 Acute embolism and thrombosis of left femoral vein: Secondary | ICD-10-CM | POA: Diagnosis not present

## 2019-10-28 DIAGNOSIS — M797 Fibromyalgia: Secondary | ICD-10-CM | POA: Diagnosis not present

## 2019-10-28 DIAGNOSIS — Z7952 Long term (current) use of systemic steroids: Secondary | ICD-10-CM | POA: Diagnosis not present

## 2019-10-28 DIAGNOSIS — U071 COVID-19: Secondary | ICD-10-CM | POA: Diagnosis not present

## 2019-10-28 DIAGNOSIS — M0689 Other specified rheumatoid arthritis, multiple sites: Secondary | ICD-10-CM | POA: Diagnosis not present

## 2019-10-28 DIAGNOSIS — M130 Polyarthritis, unspecified: Secondary | ICD-10-CM | POA: Diagnosis not present

## 2019-10-28 DIAGNOSIS — E785 Hyperlipidemia, unspecified: Secondary | ICD-10-CM | POA: Diagnosis not present

## 2019-10-28 DIAGNOSIS — J1282 Pneumonia due to coronavirus disease 2019: Secondary | ICD-10-CM | POA: Diagnosis not present

## 2019-10-28 DIAGNOSIS — Z794 Long term (current) use of insulin: Secondary | ICD-10-CM | POA: Diagnosis not present

## 2019-10-28 DIAGNOSIS — E119 Type 2 diabetes mellitus without complications: Secondary | ICD-10-CM | POA: Diagnosis not present

## 2019-10-28 DIAGNOSIS — Z791 Long term (current) use of non-steroidal anti-inflammatories (NSAID): Secondary | ICD-10-CM | POA: Diagnosis not present

## 2019-10-28 DIAGNOSIS — J45909 Unspecified asthma, uncomplicated: Secondary | ICD-10-CM | POA: Diagnosis not present

## 2019-10-28 DIAGNOSIS — I82412 Acute embolism and thrombosis of left femoral vein: Secondary | ICD-10-CM | POA: Diagnosis not present

## 2019-10-28 DIAGNOSIS — Z7901 Long term (current) use of anticoagulants: Secondary | ICD-10-CM | POA: Diagnosis not present

## 2019-10-28 DIAGNOSIS — J9601 Acute respiratory failure with hypoxia: Secondary | ICD-10-CM | POA: Diagnosis not present

## 2019-10-28 DIAGNOSIS — E1169 Type 2 diabetes mellitus with other specified complication: Secondary | ICD-10-CM | POA: Diagnosis not present

## 2019-10-29 ENCOUNTER — Ambulatory Visit: Payer: Medicare Other | Admitting: Podiatry

## 2019-10-29 ENCOUNTER — Telehealth: Payer: Self-pay | Admitting: Podiatry

## 2019-10-29 NOTE — Telephone Encounter (Signed)
Patient requested a handicapped placard. Placard was issued.

## 2019-10-30 DIAGNOSIS — J45909 Unspecified asthma, uncomplicated: Secondary | ICD-10-CM | POA: Diagnosis not present

## 2019-10-30 DIAGNOSIS — Z7952 Long term (current) use of systemic steroids: Secondary | ICD-10-CM | POA: Diagnosis not present

## 2019-10-30 DIAGNOSIS — Z7901 Long term (current) use of anticoagulants: Secondary | ICD-10-CM | POA: Diagnosis not present

## 2019-10-30 DIAGNOSIS — U071 COVID-19: Secondary | ICD-10-CM | POA: Diagnosis not present

## 2019-10-30 DIAGNOSIS — E119 Type 2 diabetes mellitus without complications: Secondary | ICD-10-CM | POA: Diagnosis not present

## 2019-10-30 DIAGNOSIS — J1282 Pneumonia due to coronavirus disease 2019: Secondary | ICD-10-CM | POA: Diagnosis not present

## 2019-10-30 DIAGNOSIS — Z791 Long term (current) use of non-steroidal anti-inflammatories (NSAID): Secondary | ICD-10-CM | POA: Diagnosis not present

## 2019-10-30 DIAGNOSIS — Z794 Long term (current) use of insulin: Secondary | ICD-10-CM | POA: Diagnosis not present

## 2019-10-30 DIAGNOSIS — M797 Fibromyalgia: Secondary | ICD-10-CM | POA: Diagnosis not present

## 2019-10-30 DIAGNOSIS — J9601 Acute respiratory failure with hypoxia: Secondary | ICD-10-CM | POA: Diagnosis not present

## 2019-10-30 DIAGNOSIS — I82412 Acute embolism and thrombosis of left femoral vein: Secondary | ICD-10-CM | POA: Diagnosis not present

## 2019-11-05 DIAGNOSIS — I82412 Acute embolism and thrombosis of left femoral vein: Secondary | ICD-10-CM | POA: Diagnosis not present

## 2019-11-05 DIAGNOSIS — E119 Type 2 diabetes mellitus without complications: Secondary | ICD-10-CM | POA: Diagnosis not present

## 2019-11-05 DIAGNOSIS — Z794 Long term (current) use of insulin: Secondary | ICD-10-CM | POA: Diagnosis not present

## 2019-11-05 DIAGNOSIS — Z7952 Long term (current) use of systemic steroids: Secondary | ICD-10-CM | POA: Diagnosis not present

## 2019-11-05 DIAGNOSIS — J9601 Acute respiratory failure with hypoxia: Secondary | ICD-10-CM | POA: Diagnosis not present

## 2019-11-05 DIAGNOSIS — Z791 Long term (current) use of non-steroidal anti-inflammatories (NSAID): Secondary | ICD-10-CM | POA: Diagnosis not present

## 2019-11-05 DIAGNOSIS — U071 COVID-19: Secondary | ICD-10-CM | POA: Diagnosis not present

## 2019-11-05 DIAGNOSIS — J45909 Unspecified asthma, uncomplicated: Secondary | ICD-10-CM | POA: Diagnosis not present

## 2019-11-05 DIAGNOSIS — Z7901 Long term (current) use of anticoagulants: Secondary | ICD-10-CM | POA: Diagnosis not present

## 2019-11-05 DIAGNOSIS — M797 Fibromyalgia: Secondary | ICD-10-CM | POA: Diagnosis not present

## 2019-11-05 DIAGNOSIS — J1282 Pneumonia due to coronavirus disease 2019: Secondary | ICD-10-CM | POA: Diagnosis not present

## 2019-11-07 DIAGNOSIS — M797 Fibromyalgia: Secondary | ICD-10-CM | POA: Diagnosis not present

## 2019-11-07 DIAGNOSIS — I82412 Acute embolism and thrombosis of left femoral vein: Secondary | ICD-10-CM | POA: Diagnosis not present

## 2019-11-07 DIAGNOSIS — Z794 Long term (current) use of insulin: Secondary | ICD-10-CM | POA: Diagnosis not present

## 2019-11-07 DIAGNOSIS — Z7952 Long term (current) use of systemic steroids: Secondary | ICD-10-CM | POA: Diagnosis not present

## 2019-11-07 DIAGNOSIS — E119 Type 2 diabetes mellitus without complications: Secondary | ICD-10-CM | POA: Diagnosis not present

## 2019-11-07 DIAGNOSIS — Z7901 Long term (current) use of anticoagulants: Secondary | ICD-10-CM | POA: Diagnosis not present

## 2019-11-07 DIAGNOSIS — J1282 Pneumonia due to coronavirus disease 2019: Secondary | ICD-10-CM | POA: Diagnosis not present

## 2019-11-07 DIAGNOSIS — Z791 Long term (current) use of non-steroidal anti-inflammatories (NSAID): Secondary | ICD-10-CM | POA: Diagnosis not present

## 2019-11-07 DIAGNOSIS — J9601 Acute respiratory failure with hypoxia: Secondary | ICD-10-CM | POA: Diagnosis not present

## 2019-11-07 DIAGNOSIS — J45909 Unspecified asthma, uncomplicated: Secondary | ICD-10-CM | POA: Diagnosis not present

## 2019-11-07 DIAGNOSIS — U071 COVID-19: Secondary | ICD-10-CM | POA: Diagnosis not present

## 2019-11-11 DIAGNOSIS — E119 Type 2 diabetes mellitus without complications: Secondary | ICD-10-CM | POA: Diagnosis not present

## 2019-11-11 DIAGNOSIS — Z794 Long term (current) use of insulin: Secondary | ICD-10-CM | POA: Diagnosis not present

## 2019-11-11 DIAGNOSIS — Z791 Long term (current) use of non-steroidal anti-inflammatories (NSAID): Secondary | ICD-10-CM | POA: Diagnosis not present

## 2019-11-11 DIAGNOSIS — J45909 Unspecified asthma, uncomplicated: Secondary | ICD-10-CM | POA: Diagnosis not present

## 2019-11-11 DIAGNOSIS — U071 COVID-19: Secondary | ICD-10-CM | POA: Diagnosis not present

## 2019-11-11 DIAGNOSIS — Z7901 Long term (current) use of anticoagulants: Secondary | ICD-10-CM | POA: Diagnosis not present

## 2019-11-11 DIAGNOSIS — J9601 Acute respiratory failure with hypoxia: Secondary | ICD-10-CM | POA: Diagnosis not present

## 2019-11-11 DIAGNOSIS — M797 Fibromyalgia: Secondary | ICD-10-CM | POA: Diagnosis not present

## 2019-11-11 DIAGNOSIS — J1282 Pneumonia due to coronavirus disease 2019: Secondary | ICD-10-CM | POA: Diagnosis not present

## 2019-11-11 DIAGNOSIS — Z7952 Long term (current) use of systemic steroids: Secondary | ICD-10-CM | POA: Diagnosis not present

## 2019-11-11 DIAGNOSIS — I82412 Acute embolism and thrombosis of left femoral vein: Secondary | ICD-10-CM | POA: Diagnosis not present

## 2019-11-14 DIAGNOSIS — Z791 Long term (current) use of non-steroidal anti-inflammatories (NSAID): Secondary | ICD-10-CM | POA: Diagnosis not present

## 2019-11-14 DIAGNOSIS — Z794 Long term (current) use of insulin: Secondary | ICD-10-CM | POA: Diagnosis not present

## 2019-11-14 DIAGNOSIS — J1282 Pneumonia due to coronavirus disease 2019: Secondary | ICD-10-CM | POA: Diagnosis not present

## 2019-11-14 DIAGNOSIS — E119 Type 2 diabetes mellitus without complications: Secondary | ICD-10-CM | POA: Diagnosis not present

## 2019-11-14 DIAGNOSIS — I82412 Acute embolism and thrombosis of left femoral vein: Secondary | ICD-10-CM | POA: Diagnosis not present

## 2019-11-14 DIAGNOSIS — Z7952 Long term (current) use of systemic steroids: Secondary | ICD-10-CM | POA: Diagnosis not present

## 2019-11-14 DIAGNOSIS — M797 Fibromyalgia: Secondary | ICD-10-CM | POA: Diagnosis not present

## 2019-11-14 DIAGNOSIS — Z7901 Long term (current) use of anticoagulants: Secondary | ICD-10-CM | POA: Diagnosis not present

## 2019-11-14 DIAGNOSIS — J9601 Acute respiratory failure with hypoxia: Secondary | ICD-10-CM | POA: Diagnosis not present

## 2019-11-14 DIAGNOSIS — U071 COVID-19: Secondary | ICD-10-CM | POA: Diagnosis not present

## 2019-11-14 DIAGNOSIS — J45909 Unspecified asthma, uncomplicated: Secondary | ICD-10-CM | POA: Diagnosis not present

## 2019-11-17 DIAGNOSIS — J0110 Acute frontal sinusitis, unspecified: Secondary | ICD-10-CM | POA: Diagnosis not present

## 2019-11-17 DIAGNOSIS — U071 COVID-19: Secondary | ICD-10-CM | POA: Diagnosis not present

## 2019-11-17 DIAGNOSIS — Z794 Long term (current) use of insulin: Secondary | ICD-10-CM | POA: Diagnosis not present

## 2019-11-17 DIAGNOSIS — Z7901 Long term (current) use of anticoagulants: Secondary | ICD-10-CM | POA: Diagnosis not present

## 2019-11-17 DIAGNOSIS — J1282 Pneumonia due to coronavirus disease 2019: Secondary | ICD-10-CM | POA: Diagnosis not present

## 2019-11-17 DIAGNOSIS — M130 Polyarthritis, unspecified: Secondary | ICD-10-CM | POA: Diagnosis not present

## 2019-11-17 DIAGNOSIS — J45909 Unspecified asthma, uncomplicated: Secondary | ICD-10-CM | POA: Diagnosis not present

## 2019-11-17 DIAGNOSIS — M797 Fibromyalgia: Secondary | ICD-10-CM | POA: Diagnosis not present

## 2019-11-17 DIAGNOSIS — E119 Type 2 diabetes mellitus without complications: Secondary | ICD-10-CM | POA: Diagnosis not present

## 2019-11-17 DIAGNOSIS — E559 Vitamin D deficiency, unspecified: Secondary | ICD-10-CM | POA: Diagnosis not present

## 2019-11-17 DIAGNOSIS — J9601 Acute respiratory failure with hypoxia: Secondary | ICD-10-CM | POA: Diagnosis not present

## 2019-11-17 DIAGNOSIS — Z791 Long term (current) use of non-steroidal anti-inflammatories (NSAID): Secondary | ICD-10-CM | POA: Diagnosis not present

## 2019-11-17 DIAGNOSIS — Z7952 Long term (current) use of systemic steroids: Secondary | ICD-10-CM | POA: Diagnosis not present

## 2019-11-17 DIAGNOSIS — I82412 Acute embolism and thrombosis of left femoral vein: Secondary | ICD-10-CM | POA: Diagnosis not present

## 2019-11-19 DIAGNOSIS — M797 Fibromyalgia: Secondary | ICD-10-CM | POA: Diagnosis not present

## 2019-11-19 DIAGNOSIS — J9601 Acute respiratory failure with hypoxia: Secondary | ICD-10-CM | POA: Diagnosis not present

## 2019-11-19 DIAGNOSIS — I82412 Acute embolism and thrombosis of left femoral vein: Secondary | ICD-10-CM | POA: Diagnosis not present

## 2019-11-19 DIAGNOSIS — J1282 Pneumonia due to coronavirus disease 2019: Secondary | ICD-10-CM | POA: Diagnosis not present

## 2019-11-19 DIAGNOSIS — Z791 Long term (current) use of non-steroidal anti-inflammatories (NSAID): Secondary | ICD-10-CM | POA: Diagnosis not present

## 2019-11-19 DIAGNOSIS — Z7952 Long term (current) use of systemic steroids: Secondary | ICD-10-CM | POA: Diagnosis not present

## 2019-11-19 DIAGNOSIS — Z7901 Long term (current) use of anticoagulants: Secondary | ICD-10-CM | POA: Diagnosis not present

## 2019-11-19 DIAGNOSIS — U071 COVID-19: Secondary | ICD-10-CM | POA: Diagnosis not present

## 2019-11-19 DIAGNOSIS — E119 Type 2 diabetes mellitus without complications: Secondary | ICD-10-CM | POA: Diagnosis not present

## 2019-11-19 DIAGNOSIS — Z794 Long term (current) use of insulin: Secondary | ICD-10-CM | POA: Diagnosis not present

## 2019-11-19 DIAGNOSIS — J45909 Unspecified asthma, uncomplicated: Secondary | ICD-10-CM | POA: Diagnosis not present

## 2019-11-24 ENCOUNTER — Ambulatory Visit: Payer: Medicare Other | Admitting: Podiatry

## 2019-11-24 DIAGNOSIS — Z7952 Long term (current) use of systemic steroids: Secondary | ICD-10-CM | POA: Diagnosis not present

## 2019-11-24 DIAGNOSIS — J1282 Pneumonia due to coronavirus disease 2019: Secondary | ICD-10-CM | POA: Diagnosis not present

## 2019-11-24 DIAGNOSIS — J45909 Unspecified asthma, uncomplicated: Secondary | ICD-10-CM | POA: Diagnosis not present

## 2019-11-24 DIAGNOSIS — Z791 Long term (current) use of non-steroidal anti-inflammatories (NSAID): Secondary | ICD-10-CM | POA: Diagnosis not present

## 2019-11-24 DIAGNOSIS — I82412 Acute embolism and thrombosis of left femoral vein: Secondary | ICD-10-CM | POA: Diagnosis not present

## 2019-11-24 DIAGNOSIS — Z794 Long term (current) use of insulin: Secondary | ICD-10-CM | POA: Diagnosis not present

## 2019-11-24 DIAGNOSIS — J9601 Acute respiratory failure with hypoxia: Secondary | ICD-10-CM | POA: Diagnosis not present

## 2019-11-24 DIAGNOSIS — M797 Fibromyalgia: Secondary | ICD-10-CM | POA: Diagnosis not present

## 2019-11-24 DIAGNOSIS — E119 Type 2 diabetes mellitus without complications: Secondary | ICD-10-CM | POA: Diagnosis not present

## 2019-11-24 DIAGNOSIS — Z7901 Long term (current) use of anticoagulants: Secondary | ICD-10-CM | POA: Diagnosis not present

## 2019-11-24 DIAGNOSIS — U071 COVID-19: Secondary | ICD-10-CM | POA: Diagnosis not present

## 2019-11-26 DIAGNOSIS — Z7901 Long term (current) use of anticoagulants: Secondary | ICD-10-CM | POA: Diagnosis not present

## 2019-11-26 DIAGNOSIS — M797 Fibromyalgia: Secondary | ICD-10-CM | POA: Diagnosis not present

## 2019-11-26 DIAGNOSIS — I82412 Acute embolism and thrombosis of left femoral vein: Secondary | ICD-10-CM | POA: Diagnosis not present

## 2019-11-26 DIAGNOSIS — J45909 Unspecified asthma, uncomplicated: Secondary | ICD-10-CM | POA: Diagnosis not present

## 2019-11-26 DIAGNOSIS — Z791 Long term (current) use of non-steroidal anti-inflammatories (NSAID): Secondary | ICD-10-CM | POA: Diagnosis not present

## 2019-11-26 DIAGNOSIS — E119 Type 2 diabetes mellitus without complications: Secondary | ICD-10-CM | POA: Diagnosis not present

## 2019-11-26 DIAGNOSIS — Z794 Long term (current) use of insulin: Secondary | ICD-10-CM | POA: Diagnosis not present

## 2019-11-26 DIAGNOSIS — J1282 Pneumonia due to coronavirus disease 2019: Secondary | ICD-10-CM | POA: Diagnosis not present

## 2019-11-26 DIAGNOSIS — J9601 Acute respiratory failure with hypoxia: Secondary | ICD-10-CM | POA: Diagnosis not present

## 2019-11-26 DIAGNOSIS — Z7952 Long term (current) use of systemic steroids: Secondary | ICD-10-CM | POA: Diagnosis not present

## 2019-11-26 DIAGNOSIS — U071 COVID-19: Secondary | ICD-10-CM | POA: Diagnosis not present

## 2019-11-27 DIAGNOSIS — E1169 Type 2 diabetes mellitus with other specified complication: Secondary | ICD-10-CM | POA: Diagnosis not present

## 2019-11-27 DIAGNOSIS — E785 Hyperlipidemia, unspecified: Secondary | ICD-10-CM | POA: Diagnosis not present

## 2019-11-27 DIAGNOSIS — M130 Polyarthritis, unspecified: Secondary | ICD-10-CM | POA: Diagnosis not present

## 2019-12-03 DIAGNOSIS — E119 Type 2 diabetes mellitus without complications: Secondary | ICD-10-CM | POA: Diagnosis not present

## 2019-12-03 DIAGNOSIS — Z794 Long term (current) use of insulin: Secondary | ICD-10-CM | POA: Diagnosis not present

## 2019-12-03 DIAGNOSIS — M797 Fibromyalgia: Secondary | ICD-10-CM | POA: Diagnosis not present

## 2019-12-03 DIAGNOSIS — J1282 Pneumonia due to coronavirus disease 2019: Secondary | ICD-10-CM | POA: Diagnosis not present

## 2019-12-03 DIAGNOSIS — J9601 Acute respiratory failure with hypoxia: Secondary | ICD-10-CM | POA: Diagnosis not present

## 2019-12-03 DIAGNOSIS — Z7952 Long term (current) use of systemic steroids: Secondary | ICD-10-CM | POA: Diagnosis not present

## 2019-12-03 DIAGNOSIS — I82412 Acute embolism and thrombosis of left femoral vein: Secondary | ICD-10-CM | POA: Diagnosis not present

## 2019-12-03 DIAGNOSIS — U071 COVID-19: Secondary | ICD-10-CM | POA: Diagnosis not present

## 2019-12-03 DIAGNOSIS — J45909 Unspecified asthma, uncomplicated: Secondary | ICD-10-CM | POA: Diagnosis not present

## 2019-12-03 DIAGNOSIS — Z791 Long term (current) use of non-steroidal anti-inflammatories (NSAID): Secondary | ICD-10-CM | POA: Diagnosis not present

## 2019-12-03 DIAGNOSIS — Z7901 Long term (current) use of anticoagulants: Secondary | ICD-10-CM | POA: Diagnosis not present

## 2019-12-05 DIAGNOSIS — Z791 Long term (current) use of non-steroidal anti-inflammatories (NSAID): Secondary | ICD-10-CM | POA: Diagnosis not present

## 2019-12-05 DIAGNOSIS — J9601 Acute respiratory failure with hypoxia: Secondary | ICD-10-CM | POA: Diagnosis not present

## 2019-12-05 DIAGNOSIS — J45909 Unspecified asthma, uncomplicated: Secondary | ICD-10-CM | POA: Diagnosis not present

## 2019-12-05 DIAGNOSIS — Z7901 Long term (current) use of anticoagulants: Secondary | ICD-10-CM | POA: Diagnosis not present

## 2019-12-05 DIAGNOSIS — Z7952 Long term (current) use of systemic steroids: Secondary | ICD-10-CM | POA: Diagnosis not present

## 2019-12-05 DIAGNOSIS — Z794 Long term (current) use of insulin: Secondary | ICD-10-CM | POA: Diagnosis not present

## 2019-12-05 DIAGNOSIS — M797 Fibromyalgia: Secondary | ICD-10-CM | POA: Diagnosis not present

## 2019-12-05 DIAGNOSIS — J1282 Pneumonia due to coronavirus disease 2019: Secondary | ICD-10-CM | POA: Diagnosis not present

## 2019-12-05 DIAGNOSIS — E119 Type 2 diabetes mellitus without complications: Secondary | ICD-10-CM | POA: Diagnosis not present

## 2019-12-05 DIAGNOSIS — I82412 Acute embolism and thrombosis of left femoral vein: Secondary | ICD-10-CM | POA: Diagnosis not present

## 2019-12-05 DIAGNOSIS — U071 COVID-19: Secondary | ICD-10-CM | POA: Diagnosis not present

## 2019-12-09 DIAGNOSIS — I82412 Acute embolism and thrombosis of left femoral vein: Secondary | ICD-10-CM | POA: Diagnosis not present

## 2019-12-09 DIAGNOSIS — J45909 Unspecified asthma, uncomplicated: Secondary | ICD-10-CM | POA: Diagnosis not present

## 2019-12-09 DIAGNOSIS — Z791 Long term (current) use of non-steroidal anti-inflammatories (NSAID): Secondary | ICD-10-CM | POA: Diagnosis not present

## 2019-12-09 DIAGNOSIS — J9601 Acute respiratory failure with hypoxia: Secondary | ICD-10-CM | POA: Diagnosis not present

## 2019-12-09 DIAGNOSIS — Z794 Long term (current) use of insulin: Secondary | ICD-10-CM | POA: Diagnosis not present

## 2019-12-09 DIAGNOSIS — Z7952 Long term (current) use of systemic steroids: Secondary | ICD-10-CM | POA: Diagnosis not present

## 2019-12-09 DIAGNOSIS — J1282 Pneumonia due to coronavirus disease 2019: Secondary | ICD-10-CM | POA: Diagnosis not present

## 2019-12-09 DIAGNOSIS — M797 Fibromyalgia: Secondary | ICD-10-CM | POA: Diagnosis not present

## 2019-12-09 DIAGNOSIS — U071 COVID-19: Secondary | ICD-10-CM | POA: Diagnosis not present

## 2019-12-09 DIAGNOSIS — Z7901 Long term (current) use of anticoagulants: Secondary | ICD-10-CM | POA: Diagnosis not present

## 2019-12-09 DIAGNOSIS — E119 Type 2 diabetes mellitus without complications: Secondary | ICD-10-CM | POA: Diagnosis not present

## 2019-12-12 DIAGNOSIS — J45909 Unspecified asthma, uncomplicated: Secondary | ICD-10-CM | POA: Diagnosis not present

## 2019-12-12 DIAGNOSIS — Z7901 Long term (current) use of anticoagulants: Secondary | ICD-10-CM | POA: Diagnosis not present

## 2019-12-12 DIAGNOSIS — J1282 Pneumonia due to coronavirus disease 2019: Secondary | ICD-10-CM | POA: Diagnosis not present

## 2019-12-12 DIAGNOSIS — U071 COVID-19: Secondary | ICD-10-CM | POA: Diagnosis not present

## 2019-12-12 DIAGNOSIS — E119 Type 2 diabetes mellitus without complications: Secondary | ICD-10-CM | POA: Diagnosis not present

## 2019-12-12 DIAGNOSIS — I82412 Acute embolism and thrombosis of left femoral vein: Secondary | ICD-10-CM | POA: Diagnosis not present

## 2019-12-12 DIAGNOSIS — Z794 Long term (current) use of insulin: Secondary | ICD-10-CM | POA: Diagnosis not present

## 2019-12-12 DIAGNOSIS — Z791 Long term (current) use of non-steroidal anti-inflammatories (NSAID): Secondary | ICD-10-CM | POA: Diagnosis not present

## 2019-12-12 DIAGNOSIS — J9601 Acute respiratory failure with hypoxia: Secondary | ICD-10-CM | POA: Diagnosis not present

## 2019-12-12 DIAGNOSIS — M797 Fibromyalgia: Secondary | ICD-10-CM | POA: Diagnosis not present

## 2019-12-12 DIAGNOSIS — Z7952 Long term (current) use of systemic steroids: Secondary | ICD-10-CM | POA: Diagnosis not present

## 2019-12-15 ENCOUNTER — Telehealth: Payer: Self-pay | Admitting: Podiatry

## 2019-12-15 NOTE — Telephone Encounter (Signed)
Received a call from [facility] stating pt called them stating she was told she would have to pay 90.00 to pick up the orthotics that were made for her.. I am not sure who told pt this amount. Upon checking chart pt did get measured for orthotics but I am not seeing any signed consent forms from pt.  Pt states to [facility] she asked several times about the cost and no one ever told her the cost of the orthotics.   Do you have signed consent forms for pt. If so could you please call pt and let her know either way. If not we will have to take charges out for pt.

## 2019-12-15 NOTE — Telephone Encounter (Signed)
Financial agreement was not obtained for the orthotics to my knowledge.

## 2019-12-16 DIAGNOSIS — U071 COVID-19: Secondary | ICD-10-CM | POA: Diagnosis not present

## 2019-12-16 DIAGNOSIS — I82412 Acute embolism and thrombosis of left femoral vein: Secondary | ICD-10-CM | POA: Diagnosis not present

## 2019-12-16 DIAGNOSIS — J1282 Pneumonia due to coronavirus disease 2019: Secondary | ICD-10-CM | POA: Diagnosis not present

## 2019-12-16 DIAGNOSIS — J9601 Acute respiratory failure with hypoxia: Secondary | ICD-10-CM | POA: Diagnosis not present

## 2019-12-16 DIAGNOSIS — Z7901 Long term (current) use of anticoagulants: Secondary | ICD-10-CM | POA: Diagnosis not present

## 2019-12-16 DIAGNOSIS — E119 Type 2 diabetes mellitus without complications: Secondary | ICD-10-CM | POA: Diagnosis not present

## 2019-12-16 DIAGNOSIS — J45909 Unspecified asthma, uncomplicated: Secondary | ICD-10-CM | POA: Diagnosis not present

## 2019-12-16 DIAGNOSIS — Z791 Long term (current) use of non-steroidal anti-inflammatories (NSAID): Secondary | ICD-10-CM | POA: Diagnosis not present

## 2019-12-16 DIAGNOSIS — Z794 Long term (current) use of insulin: Secondary | ICD-10-CM | POA: Diagnosis not present

## 2019-12-16 DIAGNOSIS — Z7952 Long term (current) use of systemic steroids: Secondary | ICD-10-CM | POA: Diagnosis not present

## 2019-12-16 DIAGNOSIS — M797 Fibromyalgia: Secondary | ICD-10-CM | POA: Diagnosis not present

## 2019-12-29 DIAGNOSIS — R921 Mammographic calcification found on diagnostic imaging of breast: Secondary | ICD-10-CM | POA: Diagnosis not present

## 2020-01-05 DIAGNOSIS — Z23 Encounter for immunization: Secondary | ICD-10-CM | POA: Diagnosis not present

## 2020-01-05 DIAGNOSIS — G47419 Narcolepsy without cataplexy: Secondary | ICD-10-CM | POA: Diagnosis not present

## 2020-01-05 DIAGNOSIS — E559 Vitamin D deficiency, unspecified: Secondary | ICD-10-CM | POA: Diagnosis not present

## 2020-01-05 DIAGNOSIS — E785 Hyperlipidemia, unspecified: Secondary | ICD-10-CM | POA: Diagnosis not present

## 2020-01-05 DIAGNOSIS — E1169 Type 2 diabetes mellitus with other specified complication: Secondary | ICD-10-CM | POA: Diagnosis not present

## 2020-01-14 DIAGNOSIS — Z23 Encounter for immunization: Secondary | ICD-10-CM | POA: Diagnosis not present

## 2020-01-26 DIAGNOSIS — D242 Benign neoplasm of left breast: Secondary | ICD-10-CM | POA: Diagnosis not present

## 2020-01-26 DIAGNOSIS — R921 Mammographic calcification found on diagnostic imaging of breast: Secondary | ICD-10-CM | POA: Diagnosis not present

## 2020-01-26 DIAGNOSIS — R911 Solitary pulmonary nodule: Secondary | ICD-10-CM | POA: Diagnosis not present

## 2020-02-27 DIAGNOSIS — M130 Polyarthritis, unspecified: Secondary | ICD-10-CM | POA: Diagnosis not present

## 2020-02-27 DIAGNOSIS — E785 Hyperlipidemia, unspecified: Secondary | ICD-10-CM | POA: Diagnosis not present

## 2020-02-27 DIAGNOSIS — E1169 Type 2 diabetes mellitus with other specified complication: Secondary | ICD-10-CM | POA: Diagnosis not present

## 2020-03-01 ENCOUNTER — Ambulatory Visit: Payer: Self-pay | Admitting: Surgery

## 2020-03-01 DIAGNOSIS — D242 Benign neoplasm of left breast: Secondary | ICD-10-CM

## 2020-03-01 NOTE — H&P (Signed)
Stephanie Camacho Appointment: 03/01/2020 11:30 AM Location: [facility] Patient #: [id number] DOB: 02-Feb-1966 Single / Language: [name] / Race: Black or African American Female  History of Present Illness Stephanie Camacho A. Stephanie Franchini MD; 03/01/2020 12:16 PM) Patient words: Patient presents for evaluation of abnormal left breast mammogram. She underwent screening mammography was found to have a 5 mm cluster of left breast microcalcifications in the upper quadrant. Core biopsy showed papilloma. There was no atypia. Her mother had breast cancer in her 69s but no other relatives have had breast cancer. 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 She states she will be taken off this at the end of the month. Denies history of breast pain, nipple discharge or breast mass bilaterally.  The patient is a 55 year old female.   Past Surgical History (Stephanie Camacho, Camacho; 03/01/2020 11:39 AM) Breast Biopsy Left. Gallbladder Surgery - Laparoscopic Hysterectomy (not due to cancer) - Partial Tonsillectomy  Diagnostic Studies History (Stephanie Camacho, Camacho; 03/01/2020 11:39 AM) Colonoscopy never Mammogram within last year Pap Smear 1-5 years ago  Allergies (Stephanie Camacho, Camacho; 03/01/2020 11:40 AM) No Known Drug Allergies [03/01/2020]: Allergies Reconciled  Medication History (Stephanie Camacho, Camacho; 03/01/2020 11:41 AM) DULoxetine HCl (Oral) Specific strength unknown - Active. Jardiance (25MG  Tablet, Oral) Active. Lantus (100UNIT/ML Solution, Subcutaneous) Active. Ozempic (0.25 or 0.5 MG/DOSE) (2MG /1.5ML Soln Pen-inj, Subcutaneous) Active. Vitamin D (Oral) Specific strength unknown - Active. Eliquis (Oral) Specific strength unknown - Active. Medications Reconciled  Social History , Camacho; 03/01/2020 11:39 AM) Alcohol use Occasional alcohol use. Caffeine use Coffee, Tea. No drug use Tobacco use Never smoker.  Family History Stephanie Camacho, Camacho; 03/01/2020  11:39 AM) Arthritis Father, Mother. Breast Cancer Mother. Cerebrovascular Accident Mother. Depression Daughter. Diabetes Mellitus Mother. Heart Disease Father. Heart disease in female family member before age 59 Hypertension Mother.  Pregnancy / Birth History 04/29/2019, Camacho; 03/01/2020 11:39 AM) Age at menarche 13 years. Gravida 5 Irregular periods Maternal age <15 Para 4  Other Problems (Stephanie Camacho, Camacho; 03/01/2020 11:39 AM) Anxiety Disorder Arthritis Asthma Back Pain Cholelithiasis Diabetes Mellitus Kidney Stone Lump In Breast Migraine Headache Pulmonary Embolism / Blood Clot in Legs Sleep Apnea     Review of Systems (Stephanie Camacho; 03/01/2020 11:39 AM) General Not Present- Appetite Loss, Chills, Fatigue, Fever, Night Sweats, Weight Gain and Weight Loss. Skin Not Present- Change in Wart/Mole, Dryness, Hives, Jaundice, New Lesions, Non-Healing Wounds, Rash and Ulcer. HEENT Present- Sinus Pain and Wears glasses/contact lenses. Not Present- Earache, Hearing Loss, Hoarseness, Nose Bleed, Oral Ulcers, Ringing in the Ears, Seasonal Allergies, Sore Throat, Visual Disturbances and Yellow Eyes. Respiratory Present- Snoring. Not Present- Bloody sputum, Chronic Cough, Difficulty Breathing and Wheezing. Breast Not Present- Breast Mass, Breast Pain, Nipple Discharge and Skin Changes. Cardiovascular Present- Swelling of Extremities. Not Present- Chest Pain, Difficulty Breathing Lying Down, Leg Cramps, Palpitations, Rapid Heart Rate and Shortness of Breath. Gastrointestinal Not Present- Abdominal Pain, Bloating, Bloody Stool, Change in Bowel Habits, Chronic diarrhea, Constipation, Difficulty Swallowing, Excessive gas, Gets full quickly at meals, Hemorrhoids, Indigestion, Nausea, Rectal Pain and Vomiting. Female Genitourinary Not Present- Frequency, Nocturia, Painful Urination, Pelvic Pain and Urgency. Musculoskeletal Present- Back Pain, Joint Pain and Joint  Stiffness. Not Present- Muscle Pain, Muscle Weakness and Swelling of Extremities. Neurological Present- Numbness, Tingling and Trouble walking. Not Present- Decreased Memory, Fainting, Headaches, Seizures, Tremor and Weakness. Psychiatric Present- Anxiety. Not Present- Bipolar, Change in Sleep Pattern, Depression, Fearful and Frequent crying. Endocrine Present- Hair Changes, Hot flashes  and New Diabetes. Not Present- Cold Intolerance, Excessive Hunger and Heat Intolerance. Hematology Present- Blood Thinners. Not Present- Easy Bruising, Excessive bleeding, Gland problems, HIV and Persistent Infections.  Vitals (Stephanie Camacho; 03/01/2020 11:42 AM) 03/01/2020 11:41 AM Weight: 267.25 lb Height: 66in Body Surface Area: 2.26 m Body Mass Index: 43.13 kg/m  Temp.: 93.30F  Pulse: 91 (Regular)  BP: 132/78(Sitting, Left Arm, Standard)        Physical Exam (Stephanie Saddler A. Zaniah Titterington MD; 03/01/2020 12:16 PM)  General Mental Status-Alert. General Appearance-Consistent with stated age. Hydration-Well hydrated. Voice-Normal.  Head and Neck Head-normocephalic, atraumatic with no lesions or palpable masses. Trachea-midline. Thyroid Gland Characteristics - normal size and consistency.  Eye Eyeball - Bilateral-Extraocular movements intact. Sclera/Conjunctiva - Bilateral-No scleral icterus.  Chest and Lung Exam Chest and lung exam reveals -quiet, even and easy respiratory effort with no use of accessory muscles and on auscultation, normal breath sounds, no adventitious sounds and normal vocal resonance. Inspection Chest Wall - Normal. Back - normal.  Breast Breast - Left-Symmetric, Non Tender, No Biopsy scars, no Dimpling - Left, No Inflammation, No Lumpectomy scars, No Mastectomy scars, No Peau d' Orange. Breast - Right-Symmetric, Non Tender, No Biopsy scars, no Dimpling - Right, No Inflammation, No Lumpectomy scars, No Mastectomy scars, No Peau d' Orange. Breast  Lump-No Palpable Breast Mass.  Cardiovascular Cardiovascular examination reveals -normal heart sounds, regular rate and rhythm with no murmurs and normal pedal pulses bilaterally.  Abdomen Inspection Inspection of the abdomen reveals - No Hernias. Skin - Scar - no surgical scars. Palpation/Percussion Palpation and Percussion of the abdomen reveal - Soft, Non Tender, No Rebound tenderness, No Rigidity (guarding) and No hepatosplenomegaly. Auscultation Auscultation of the abdomen reveals - Bowel sounds normal.  Neurologic Neurologic evaluation reveals -alert and oriented x 3 with no impairment of recent or remote memory. Mental Status-Normal.  Musculoskeletal Normal Exam - Left-Upper Extremity Strength Normal and Lower Extremity Strength Normal. Normal Exam - Right-Upper Extremity Strength Normal and Lower Extremity Strength Normal.  Lymphatic Head & Neck  General Head & Neck Lymphatics: Bilateral - Description - Normal. Axillary  General Axillary Region: Bilateral - Description - Normal. Tenderness - Non Tender. Femoral & Inguinal  Generalized Femoral & Inguinal Lymphatics: Bilateral - Description - Normal. Tenderness - Non Tender.    Assessment & Plan (Bekim Werntz A. Aryan Bello MD; 03/01/2020 12:18 PM)  PAPILLOMA OF LEFT BREAST (D24.2) Impression: Discussed lumpectomy versus observation. Pros and cons of each approach discussed. Discussed genetic testing and that she some point if she was interested given her family history. She would like to proceed with left breast lumpectomy hopefully next small point she returns from travel and is removed from the coagulation. Risk of malignancy in this case around 10 %. Discussed the pro and cons of genetic testing. She will think this over but wants to proceed with lumpectomy. Risk of lumpectomy include bleeding, infection, seroma, more surgery, use of seed/wire, wound care, cosmetic deformity and the need for other treatments, death ,  blood clots, death. Pt agrees to proceed.  total time 30 minutes  Current Plans You are being scheduled for surgery- Our schedulers will call you.  You should hear from our office's scheduling department within 5 working days about the location, date, and time of surgery. We try to make accommodations for patient's preferences in scheduling surgery, but sometimes the OR schedule or the surgeon's schedule prevents [place] from making those accommodations.  If you have not heard from our office [phone]) in 5 working days,  call the office and ask for your surgeon's nurse.  If you have other questions about your diagnosis, plan, or surgery, call the office and ask for your surgeon's nurse.  Pt Education - [facility] Breast Biopsy HCI: discussed with patient and provided information.

## 2020-04-14 DIAGNOSIS — R928 Other abnormal and inconclusive findings on diagnostic imaging of breast: Secondary | ICD-10-CM | POA: Diagnosis not present

## 2020-04-19 ENCOUNTER — Encounter (HOSPITAL_BASED_OUTPATIENT_CLINIC_OR_DEPARTMENT_OTHER): Payer: Self-pay | Admitting: Surgery

## 2020-04-19 ENCOUNTER — Encounter (HOSPITAL_BASED_OUTPATIENT_CLINIC_OR_DEPARTMENT_OTHER)
Admission: RE | Admit: 2020-04-19 | Discharge: 2020-04-19 | Disposition: A | Discharge status: Home / Self Care | Payer: Medicare Other | Source: Ambulatory Visit | Attending: Surgery | Admitting: Surgery

## 2020-04-19 ENCOUNTER — Other Ambulatory Visit (HOSPITAL_COMMUNITY)
Admission: RE | Admit: 2020-04-19 | Discharge: 2020-04-19 | Disposition: A | Discharge status: Home / Self Care | Payer: Medicare Other | Source: Ambulatory Visit | Attending: Surgery | Admitting: Surgery

## 2020-04-19 ENCOUNTER — Other Ambulatory Visit: Payer: Self-pay

## 2020-04-19 DIAGNOSIS — Z01812 Encounter for preprocedural laboratory examination: Secondary | ICD-10-CM | POA: Insufficient documentation

## 2020-04-19 DIAGNOSIS — Z20822 Contact with and (suspected) exposure to covid-19: Secondary | ICD-10-CM | POA: Insufficient documentation

## 2020-04-19 DIAGNOSIS — Z01818 Encounter for other preprocedural examination: Secondary | ICD-10-CM | POA: Insufficient documentation

## 2020-04-19 LAB — COMPREHENSIVE METABOLIC PANEL
ALT: 19 U/L (ref 0–44)
AST: 15 U/L (ref 15–41)
Albumin: 3.8 g/dL (ref 3.5–5.0)
Alkaline Phosphatase: 89 U/L (ref 38–126)
Anion gap: 8 (ref 5–15)
BUN: 11 mg/dL (ref 6–20)
CO2: 27 mmol/L (ref 22–32)
Calcium: 9.1 mg/dL (ref 8.9–10.3)
Chloride: 104 mmol/L (ref 98–111)
Creatinine, Ser: 0.85 mg/dL (ref 0.44–1.00)
GFR, Estimated: >60 mL/min (ref >60)
Glucose, Bld: 137 mg/dL — ABNORMAL HIGH (ref 70–99)
Potassium: 4 mmol/L (ref 3.5–5.1)
Sodium: 139 mmol/L (ref 135–145)
Total Bilirubin: 0.5 mg/dL (ref 0.3–1.2)
Total Protein: 7.3 g/dL (ref 6.5–8.1)

## 2020-04-19 LAB — CBC WITH DIFFERENTIAL/PLATELET
Abs Immature Granulocytes: 0.03 10*3/uL (ref 0.00–0.07)
Basophils Absolute: 0.1 10*3/uL (ref 0.0–0.1)
Basophils Relative: 1 %
Eosinophils Absolute: 0.1 10*3/uL (ref 0.0–0.5)
Eosinophils Relative: 1 %
HCT: 42.5 % (ref 36.0–46.0)
Hemoglobin: 13.2 g/dL (ref 12.0–15.0)
Immature Granulocytes: 0 %
Lymphocytes Relative: 26 %
Lymphs Abs: 2.7 10*3/uL (ref 0.7–4.0)
MCH: 24.7 pg — ABNORMAL LOW (ref 26.0–34.0)
MCHC: 31.1 g/dL (ref 30.0–36.0)
MCV: 79.6 fL — ABNORMAL LOW (ref 80.0–100.0)
Monocytes Absolute: 0.6 10*3/uL (ref 0.1–1.0)
Monocytes Relative: 6 %
Neutro Abs: 7.1 10*3/uL (ref 1.7–7.7)
Neutrophils Relative %: 66 %
Platelets: 290 10*3/uL (ref 150–400)
RBC: 5.34 MIL/uL — ABNORMAL HIGH (ref 3.87–5.11)
RDW: 15.1 % (ref 11.5–15.5)
WBC: 10.7 10*3/uL — ABNORMAL HIGH (ref 4.0–10.5)
nRBC: 0 % (ref 0.0–0.2)

## 2020-04-19 LAB — SARS CORONAVIRUS 2 (TAT 6-24 HRS): SARS Coronavirus 2: NEGATIVE

## 2020-04-19 NOTE — Progress Notes (Signed)

## 2020-04-21 DIAGNOSIS — D242 Benign neoplasm of left breast: Secondary | ICD-10-CM | POA: Diagnosis not present

## 2020-04-22 ENCOUNTER — Encounter (HOSPITAL_BASED_OUTPATIENT_CLINIC_OR_DEPARTMENT_OTHER)
Admission: RE | Disposition: A | Discharge status: Home / Self Care | Payer: Self-pay | Source: Home / Self Care | Attending: Surgery

## 2020-04-22 ENCOUNTER — Other Ambulatory Visit: Payer: Self-pay

## 2020-04-22 ENCOUNTER — Ambulatory Visit (HOSPITAL_BASED_OUTPATIENT_CLINIC_OR_DEPARTMENT_OTHER): Payer: Medicare Other | Admitting: Certified Registered"

## 2020-04-22 ENCOUNTER — Encounter (HOSPITAL_BASED_OUTPATIENT_CLINIC_OR_DEPARTMENT_OTHER): Payer: Self-pay | Admitting: Surgery

## 2020-04-22 ENCOUNTER — Ambulatory Visit (HOSPITAL_BASED_OUTPATIENT_CLINIC_OR_DEPARTMENT_OTHER)
Admission: RE | Admit: 2020-04-22 | Discharge: 2020-04-22 | Disposition: A | Discharge status: Home / Self Care | Payer: Medicare Other | Attending: Surgery | Admitting: Surgery

## 2020-04-22 DIAGNOSIS — Z8261 Family history of arthritis: Secondary | ICD-10-CM | POA: Diagnosis not present

## 2020-04-22 DIAGNOSIS — Z823 Family history of stroke: Secondary | ICD-10-CM | POA: Diagnosis not present

## 2020-04-22 DIAGNOSIS — Z86718 Personal history of other venous thrombosis and embolism: Secondary | ICD-10-CM | POA: Insufficient documentation

## 2020-04-22 DIAGNOSIS — Z79899 Other long term (current) drug therapy: Secondary | ICD-10-CM | POA: Insufficient documentation

## 2020-04-22 DIAGNOSIS — Z7984 Long term (current) use of oral hypoglycemic drugs: Secondary | ICD-10-CM | POA: Diagnosis not present

## 2020-04-22 DIAGNOSIS — N6012 Diffuse cystic mastopathy of left breast: Secondary | ICD-10-CM | POA: Diagnosis not present

## 2020-04-22 DIAGNOSIS — Z8249 Family history of ischemic heart disease and other diseases of the circulatory system: Secondary | ICD-10-CM | POA: Diagnosis not present

## 2020-04-22 DIAGNOSIS — Z833 Family history of diabetes mellitus: Secondary | ICD-10-CM | POA: Insufficient documentation

## 2020-04-22 DIAGNOSIS — E119 Type 2 diabetes mellitus without complications: Secondary | ICD-10-CM | POA: Diagnosis not present

## 2020-04-22 DIAGNOSIS — Z794 Long term (current) use of insulin: Secondary | ICD-10-CM | POA: Diagnosis not present

## 2020-04-22 DIAGNOSIS — J45909 Unspecified asthma, uncomplicated: Secondary | ICD-10-CM | POA: Diagnosis not present

## 2020-04-22 DIAGNOSIS — Z7901 Long term (current) use of anticoagulants: Secondary | ICD-10-CM | POA: Diagnosis not present

## 2020-04-22 DIAGNOSIS — D242 Benign neoplasm of left breast: Secondary | ICD-10-CM | POA: Insufficient documentation

## 2020-04-22 DIAGNOSIS — Z818 Family history of other mental and behavioral disorders: Secondary | ICD-10-CM | POA: Insufficient documentation

## 2020-04-22 DIAGNOSIS — Z803 Family history of malignant neoplasm of breast: Secondary | ICD-10-CM | POA: Insufficient documentation

## 2020-04-22 DIAGNOSIS — M797 Fibromyalgia: Secondary | ICD-10-CM | POA: Diagnosis not present

## 2020-04-22 DIAGNOSIS — N6082 Other benign mammary dysplasias of left breast: Secondary | ICD-10-CM | POA: Diagnosis not present

## 2020-04-22 DIAGNOSIS — N6092 Unspecified benign mammary dysplasia of left breast: Secondary | ICD-10-CM | POA: Diagnosis not present

## 2020-04-22 HISTORY — DX: Sleep apnea, unspecified: G47.30

## 2020-04-22 HISTORY — DX: Acute embolism and thrombosis of unspecified deep veins of lower extremity, bilateral: I82.403

## 2020-04-22 HISTORY — PX: BREAST LUMPECTOMY WITH RADIOACTIVE SEED LOCALIZATION: SHX6424

## 2020-04-22 LAB — GLUCOSE, CAPILLARY
Glucose-Capillary: 133 mg/dL — ABNORMAL HIGH (ref 70–99)
Glucose-Capillary: 128 mg/dL — ABNORMAL HIGH (ref 70–99)

## 2020-04-22 SURGERY — BREAST LUMPECTOMY WITH RADIOACTIVE SEED LOCALIZATION
Anesthesia: General | Site: Breast | Laterality: Left

## 2020-04-22 MED ORDER — PROPOFOL 10 MG/ML IV BOLUS
INTRAVENOUS | Status: AC
  Filled 2020-04-22: qty 20

## 2020-04-22 MED ORDER — DEXAMETHASONE SODIUM PHOSPHATE 10 MG/ML IJ SOLN
INTRAMUSCULAR | Status: AC
  Filled 2020-04-22: qty 1

## 2020-04-22 MED ORDER — ACETAMINOPHEN 500 MG PO TABS
1000.0000 mg | ORAL_TABLET | ORAL | Status: AC
  Administered 2020-04-22: 1000 mg via ORAL

## 2020-04-22 MED ORDER — FENTANYL CITRATE (PF) 100 MCG/2ML IJ SOLN
INTRAMUSCULAR | Status: DC | PRN
  Administered 2020-04-22: 50 ug via INTRAVENOUS
  Administered 2020-04-22 (×2): 25 ug via INTRAVENOUS

## 2020-04-22 MED ORDER — CHLORHEXIDINE GLUCONATE CLOTH 2 % EX PADS: 6.0000 | MEDICATED_PAD | Freq: Once | CUTANEOUS | Status: DC

## 2020-04-22 MED ORDER — CEFAZOLIN SODIUM-DEXTROSE 2-4 GM/100ML-% IV SOLN
INTRAVENOUS | Status: AC
  Filled 2020-04-22: qty 100

## 2020-04-22 MED ORDER — CEFAZOLIN SODIUM-DEXTROSE 2-3 GM-%(50ML) IV SOLR
INTRAVENOUS | Status: DC | PRN
  Administered 2020-04-22: 3 g via INTRAVENOUS

## 2020-04-22 MED ORDER — DEXTROSE 5 % IV SOLN
3.0000 g | INTRAVENOUS | Status: DC
  Filled 2020-04-22: qty 3000

## 2020-04-22 MED ORDER — MIDAZOLAM HCL 2 MG/2ML IJ SOLN
INTRAMUSCULAR | Status: AC
  Filled 2020-04-22: qty 2

## 2020-04-22 MED ORDER — HYDROMORPHONE HCL 1 MG/ML IJ SOLN: 0.2500 mg | INTRAMUSCULAR | Status: DC | PRN

## 2020-04-22 MED ORDER — ACETAMINOPHEN 500 MG PO TABS
ORAL_TABLET | ORAL | Status: AC
  Filled 2020-04-22: qty 2

## 2020-04-22 MED ORDER — ONDANSETRON HCL 4 MG/2ML IJ SOLN
INTRAMUSCULAR | Status: DC | PRN
  Administered 2020-04-22: 4 mg via INTRAVENOUS

## 2020-04-22 MED ORDER — SODIUM CHLORIDE 0.9% IV SOLUTION: Freq: Once | INTRAVENOUS | Status: DC

## 2020-04-22 MED ORDER — SODIUM CHLORIDE 0.9 % IV SOLN
INTRAVENOUS | Status: DC | PRN
  Administered 2020-04-22: 100 mL

## 2020-04-22 MED ORDER — FENTANYL CITRATE (PF) 100 MCG/2ML IJ SOLN
INTRAMUSCULAR | Status: AC
  Filled 2020-04-22: qty 2

## 2020-04-22 MED ORDER — FENTANYL CITRATE (PF) 100 MCG/2ML IJ SOLN: 25.0000 ug | INTRAMUSCULAR | Status: DC | PRN

## 2020-04-22 MED ORDER — ONDANSETRON HCL 4 MG/2ML IJ SOLN
INTRAMUSCULAR | Status: AC
  Filled 2020-04-22: qty 2

## 2020-04-22 MED ORDER — FENTANYL CITRATE (PF) 100 MCG/2ML IJ SOLN
25.0000 ug | INTRAMUSCULAR | Status: DC | PRN
Start: 2020-04-22 — End: 2020-04-22

## 2020-04-22 MED ORDER — FENTANYL CITRATE (PF) 100 MCG/2ML IJ SOLN
100.0000 ug | Freq: Once | INTRAMUSCULAR | Status: AC
  Administered 2020-04-22: 50 ug via INTRAVENOUS

## 2020-04-22 MED ORDER — CEFAZOLIN SODIUM-DEXTROSE 1-4 GM/50ML-% IV SOLN
INTRAVENOUS | Status: AC
  Filled 2020-04-22: qty 50

## 2020-04-22 MED ORDER — LACTATED RINGERS IV SOLN: INTRAVENOUS | Status: DC | PRN

## 2020-04-22 MED ORDER — BUPIVACAINE-EPINEPHRINE (PF) 0.25% -1:200000 IJ SOLN
INTRAMUSCULAR | Status: DC | PRN
  Administered 2020-04-22: 20 mL via PERINEURAL

## 2020-04-22 MED ORDER — LACTATED RINGERS IV SOLN: INTRAVENOUS | Status: DC

## 2020-04-22 MED ORDER — DEXAMETHASONE SODIUM PHOSPHATE 10 MG/ML IJ SOLN
INTRAMUSCULAR | Status: DC | PRN
  Administered 2020-04-22: 5 mg via INTRAVENOUS

## 2020-04-22 MED ORDER — HYDROCODONE-ACETAMINOPHEN 5-325 MG PO TABS: 1.0000 | ORAL_TABLET | Freq: Four times a day (QID) | ORAL | 0 refills | Status: AC | PRN

## 2020-04-22 MED ORDER — GABAPENTIN 300 MG PO CAPS
ORAL_CAPSULE | ORAL | Status: AC
  Filled 2020-04-22: qty 1

## 2020-04-22 MED ORDER — GABAPENTIN 300 MG PO CAPS
300.0000 mg | ORAL_CAPSULE | ORAL | Status: AC
  Administered 2020-04-22: 300 mg via ORAL

## 2020-04-22 MED ORDER — LIDOCAINE 2% (20 MG/ML) 5 ML SYRINGE
INTRAMUSCULAR | Status: AC
  Filled 2020-04-22: qty 5

## 2020-04-22 MED ORDER — FENTANYL CITRATE (PF) 100 MCG/2ML IJ SOLN
25.0000 ug | INTRAMUSCULAR | Status: DC | PRN
  Administered 2020-04-22: 50 ug via INTRAVENOUS

## 2020-04-22 MED ORDER — PROPOFOL 10 MG/ML IV BOLUS
INTRAVENOUS | Status: DC | PRN
  Administered 2020-04-22: 200 mg via INTRAVENOUS

## 2020-04-22 MED ORDER — LIDOCAINE HCL (CARDIAC) PF 100 MG/5ML IV SOSY
PREFILLED_SYRINGE | INTRAVENOUS | Status: DC | PRN
  Administered 2020-04-22: 100 mg via INTRAVENOUS

## 2020-04-22 SURGICAL SUPPLY — 49 items
ADH SKN CLS APL DERMABOND .7 (GAUZE/BANDAGES/DRESSINGS) ×1
APL PRP STRL LF DISP 70% ISPRP (MISCELLANEOUS) ×1
APPLIER CLIP 9.375 MED OPEN (MISCELLANEOUS)
APR CLP MED 9.3 20 MLT OPN (MISCELLANEOUS)
BINDER BREAST XLRG (GAUZE/BANDAGES/DRESSINGS) IMPLANT
BINDER BREAST XXLRG (GAUZE/BANDAGES/DRESSINGS) IMPLANT
BLADE SURG 15 STRL LF DISP TIS (BLADE) ×1 IMPLANT
BLADE SURG 15 STRL SS (BLADE) ×2
CANISTER SUC SOCK COL 7IN (MISCELLANEOUS) IMPLANT
CANISTER SUCT 1200ML W/VALVE (MISCELLANEOUS) IMPLANT
CHLORAPREP W/TINT 26 (MISCELLANEOUS) ×2 IMPLANT
CLIP APPLIE 9.375 MED OPEN (MISCELLANEOUS) IMPLANT
COVER BACK TABLE 60X90IN (DRAPES) ×2 IMPLANT
COVER MAYO STAND STRL (DRAPES) ×2 IMPLANT
COVER PROBE W GEL 5X96 (DRAPES) ×2 IMPLANT
DECANTER SPIKE VIAL GLASS SM (MISCELLANEOUS) IMPLANT
DERMABOND ADVANCED (GAUZE/BANDAGES/DRESSINGS) ×1
DERMABOND ADVANCED .7 DNX12 (GAUZE/BANDAGES/DRESSINGS) ×1 IMPLANT
DRAPE LAPAROTOMY 100X72 PEDS (DRAPES) ×2 IMPLANT
DRAPE UTILITY XL STRL (DRAPES) ×2 IMPLANT
ELECT COATED BLADE 2.86 ST (ELECTRODE) ×2 IMPLANT
ELECT REM PT RETURN 9FT ADLT (ELECTROSURGICAL) ×2
ELECTRODE REM PT RTRN 9FT ADLT (ELECTROSURGICAL) ×1 IMPLANT
GLOVE SRG 8 PF TXTR STRL LF DI (GLOVE) ×1 IMPLANT
GLOVE SURG ENC MOIS LTX SZ6.5 (GLOVE) ×4 IMPLANT
GLOVE SURG LTX SZ8 (GLOVE) ×2 IMPLANT
GLOVE SURG UNDER POLY LF SZ7 (GLOVE) ×2 IMPLANT
GLOVE SURG UNDER POLY LF SZ8 (GLOVE) ×2
GOWN STRL REUS W/ TWL LRG LVL3 (GOWN DISPOSABLE) ×2 IMPLANT
GOWN STRL REUS W/ TWL XL LVL3 (GOWN DISPOSABLE) ×1 IMPLANT
GOWN STRL REUS W/TWL LRG LVL3 (GOWN DISPOSABLE) ×4
GOWN STRL REUS W/TWL XL LVL3 (GOWN DISPOSABLE) ×2
HEMOSTAT ARISTA ABSORB 3G PWDR (HEMOSTASIS) IMPLANT
HEMOSTAT SNOW SURGICEL 2X4 (HEMOSTASIS) IMPLANT
KIT MARKER MARGIN INK (KITS) ×2 IMPLANT
NEEDLE HYPO 25X1 1.5 SAFETY (NEEDLE) ×2 IMPLANT
NS IRRIG 1000ML POUR BTL (IV SOLUTION) ×2 IMPLANT
PACK BASIN DAY SURGERY FS (CUSTOM PROCEDURE TRAY) ×2 IMPLANT
PENCIL SMOKE EVACUATOR (MISCELLANEOUS) ×2 IMPLANT
SLEEVE SCD COMPRESS KNEE MED (MISCELLANEOUS) ×2 IMPLANT
SPONGE LAP 4X18 RFD (DISPOSABLE) ×2 IMPLANT
SUT MNCRL AB 4-0 PS2 18 (SUTURE) ×2 IMPLANT
SUT SILK 2 0 SH (SUTURE) IMPLANT
SUT VICRYL 3-0 CR8 SH (SUTURE) ×2 IMPLANT
SYR CONTROL 10ML LL (SYRINGE) ×2 IMPLANT
TOWEL GREEN STERILE FF (TOWEL DISPOSABLE) ×2 IMPLANT
TRAY FAXITRON CT DISP (TRAY / TRAY PROCEDURE) ×2 IMPLANT
TUBE CONNECTING 20X1/4 (TUBING) IMPLANT
YANKAUER SUCT BULB TIP NO VENT (SUCTIONS) IMPLANT

## 2020-04-22 NOTE — Op Note (Addendum)
Peoperative diagnosis: Left breast papilloma  Postoperative diagnosis: Same   Procedure: Left breast seed localized lumpectomy  Surgeon: Erroll Luna M.D.  Anesthesia: Gen. With 0.25% Sensorcaine local  EBL: 20 cc  Specimen: Left breast tissue with clip and radioactive seed in the specimen. Verified with neoprobe and radiographic image showing both seed and clip in specimen  Indications for procedure: The patient presents for left breast excisional lumpectomy after core biopsy showed papilloma. Discussed the rationale for considering excision. Small risk of malignancy associated with papilloma lesion after core biopsy. Discussed observation. Discussed wire localization. Patient desired excision of left breast papilloma.The procedure has been discussed with the patient. Alternatives to surgery have been discussed with the patient.  Risks of surgery include bleeding,  Infection,  Seroma formation, death,  and the need for further surgery.   The patient understands and wishes to proceed.   Description of procedure: Patient underwent seed placement as an outpatient. Patient presents today for left breast seed localized lumpectomy.  Neoprobe used to check seed location left breast.  Patient seen in the  holding area. Questions are answered and neoprobe used to verify seed location. Patient taken back to the operating room and placed upon the OR table. After induction of general anesthesia, left breast prepped and draped in a sterile fashion. Timeout was done to verify proper sizing procedure. Neoprobe used and hot spot identified and left breast upper-inner quadrant. This was marked with pen. Linear  incision made left upper outer quadrant breast. Dissection used with the help of a neoprobe around the tissue where the seed and clip were located. Tissue removed in its entirety with gross margins.. Neoprobe used and clip in the specimen .  Seed removed separately.   Radiographs taken which show clip and  seed .Hemostasis achieved and cavity closed with 3-0 Vicryl and 4-0 Monocryl. Dermabond applied. All final counts found to be correct. Specimen transported to pathology. Patient awoke extubated taken to recovery in satisfactory condition.

## 2020-04-22 NOTE — Anesthesia Postprocedure Evaluation (Signed)
Anesthesia Post Note  Patient: Stephanie Camacho  Procedure(s) Performed: LEFT BREAST LUMPECTOMY WITH RADIOACTIVE SEED LOCALIZATION (Left Breast)     Anesthesia Post Evaluation No complications documented.  Last Vitals:  Vitals:   04/22/20 1201 04/22/20 1215  BP: 128/73 133/77  Pulse: 79 72  Resp: 14 12  Temp:    SpO2: 100% 96%    Last Pain:  Vitals:   04/22/20 1201  TempSrc:   PainSc: 5                  Abram Sax

## 2020-04-22 NOTE — Anesthesia Postprocedure Evaluation (Signed)
Anesthesia Post Note  Patient: Stephanie Camacho  Procedure(s) Performed: LEFT BREAST LUMPECTOMY WITH RADIOACTIVE SEED LOCALIZATION (Left Breast)     Patient location during evaluation: PACU Anesthesia Type: General Level of consciousness: awake Pain management: pain level controlled Vital Signs Assessment: post-procedure vital signs reviewed and stable Respiratory status: spontaneous breathing Cardiovascular status: stable Postop Assessment: no apparent nausea or vomiting Anesthetic complications: no   No complications documented.  Last Vitals:  Vitals:   04/22/20 1230 04/22/20 1324  BP: 125/69 135/60  Pulse: 67 75  Resp: 15 14  Temp:  36.6 C  SpO2: (!) 88% 100%    Last Pain:  Vitals:   04/22/20 1324  TempSrc:   PainSc: 3                  Conall Vangorder

## 2020-04-22 NOTE — H&P (Signed)
Stephanie Camacho  Location: [facility] Patient #: [id number] DOB: [birth year] Single / Language: Stephanie Camacho / Race: Black or African American Female  History of Present Illness 12:16 PM) Patient words: Patient presents for evaluation of abnormal left breast mammogram. She underwent screening mammography was found to have a 5 mm cluster of left breast microcalcifications in the upper quadrant. Core biopsy showed papilloma. There was no atypia. Her mother had breast cancer in her 37s but no other relatives have had breast cancer. 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 She states she will be taken off this at the end of the month. Denies history of breast pain, nipple discharge or breast mass bilaterally.  The patient is a 55 year old female.   Past Surgical History  Breast Biopsy Left. Gallbladder Surgery - Laparoscopic Hysterectomy (not due to cancer) - Partial Tonsillectomy  Diagnostic Studies History ( AM) Colonoscopy never Mammogram within last year Pap Smear 1-5 years ago  Allergies No Known Drug Allergies [03/01/2020]: Allergies Reconciled  Medication History  DULoxetine HCl (Oral) Specific strength unknown - Active. Jardiance (25MG  Tablet, Oral) Active. Lantus (100UNIT/ML Solution, Subcutaneous) Active. Ozempic (0.25 or 0.5 MG/DOSE) (2MG /1.5ML Soln Pen-inj, Subcutaneous) Active. Vitamin D (Oral) Specific strength unknown - Active. Eliquis (Oral) Specific strength unknown - Active. Medications Reconciled  Social History Alcohol use Occasional alcohol use. Caffeine use Coffee, Tea. No drug use Tobacco use Never smoker.  Family History Arthritis Father, Mother. Breast Cancer Mother. Cerebrovascular Accident Mother. Depression Daughter. Diabetes Mellitus Mother. Heart Disease Father. Heart disease in female family member before age 63 Hypertension Mother.  Pregnancy / Birth History   AM) Age at menarche 65 years. Gravida 5 Irregular periods Maternal age <15 Para 4  Other Problems  Anxiety Disorder Arthritis Asthma Back Pain Cholelithiasis Diabetes Mellitus Kidney Stone Lump In Breast Migraine Headache Pulmonary Embolism / Blood Clot in Legs Sleep Apnea     Review of Systems General Not Present- Appetite Loss, Chills, Fatigue, Fever, Night Sweats, Weight Gain and Weight Loss. Skin Not Present- Change in Wart/Mole, Dryness, Hives, Jaundice, New Lesions, Non-Healing Wounds, Rash and Ulcer. HEENT Present- Sinus Pain and Wears glasses/contact lenses. Not Present- Earache, Hearing Loss, Hoarseness, Nose Bleed, Oral Ulcers, Ringing in the Ears, Seasonal Allergies, Sore Throat, Visual Disturbances and Yellow Eyes. Respiratory Present- Snoring. Not Present- Bloody sputum, Chronic Cough, Difficulty Breathing and Wheezing. Breast Not Present- Breast Mass, Breast Pain, Nipple Discharge and Skin Changes. Cardiovascular Present- Swelling of Extremities. Not Present- Chest Pain, Difficulty Breathing Lying Down, Leg Cramps, Palpitations, Rapid Heart Rate and Shortness of Breath. Gastrointestinal Not Present- Abdominal Pain, Bloating, Bloody Stool, Change in Bowel Habits, Chronic diarrhea, Constipation, Difficulty Swallowing, Excessive gas, Gets full quickly at meals, Hemorrhoids, Indigestion, Nausea, Rectal Pain and Vomiting. Female Genitourinary Not Present- Frequency, Nocturia, Painful Urination, Pelvic Pain and Urgency. Musculoskeletal Present- Back Pain, Joint Pain and Joint Stiffness. Not Present- Muscle Pain, Muscle Weakness and Swelling of Extremities. Neurological Present- Numbness, Tingling and Trouble walking. Not Present- Decreased Memory, Fainting, Headaches, Seizures, Tremor and Weakness. Psychiatric Present- Anxiety. Not Present- Bipolar, Change in Sleep Pattern, Depression, Fearful and Frequent crying. Endocrine Present- Hair Changes, Hot  flashes and New Diabetes. Not Present- Cold Intolerance, Excessive Hunger and Heat Intolerance. Hematology Present- Blood Thinners. Not Present- Easy Bruising, Excessive bleeding, Gland problems, HIV and Persistent Infections.  Vitals 03/01/2020 11:41 AM Weight: 267.25 lb Height: 66in Body Surface Area: 2.26 m Body Mass Index: 43.13 kg/m  Temp.: 93.90F  Pulse: 91 (Regular)  BP: 132/78(Sitting, Left Arm, Standard)        Physical Exam  General Mental Status-Alert. General Appearance-Consistent with stated age. Hydration-Well hydrated. Voice-Normal.  Head and Neck Head-normocephalic, atraumatic with no lesions or palpable masses. Trachea-midline. Thyroid Gland Characteristics - normal size and consistency.  Eye Eyeball - Bilateral-Extraocular movements intact. Sclera/Conjunctiva - Bilateral-No scleral icterus.  Chest and Lung Exam Chest and lung exam reveals -quiet, even and easy respiratory effort with no use of accessory muscles and on auscultation, normal breath sounds, no adventitious sounds and normal vocal resonance. Inspection Chest Wall - Normal. Back - normal.  Breast Breast - Left-Symmetric, Non Tender, No Biopsy scars, no Dimpling - Left, No Inflammation, No Lumpectomy scars, No Mastectomy scars, No Peau d' Orange. Breast - Right-Symmetric, Non Tender, No Biopsy scars, no Dimpling - Right, No Inflammation, No Lumpectomy scars, No Mastectomy scars, No Peau d' Orange. Breast Lump-No Palpable Breast Mass.  Cardiovascular Cardiovascular examination reveals -normal heart sounds, regular rate and rhythm with no murmurs and normal pedal pulses bilaterally.  Abdomen Inspection Inspection of the abdomen reveals - No Hernias. Skin - Scar - no surgical scars. Palpation/Percussion Palpation and Percussion of the abdomen reveal - Soft, Non Tender, No Rebound tenderness, No Rigidity (guarding) and No  hepatosplenomegaly. Auscultation Auscultation of the abdomen reveals - Bowel sounds normal.  Neurologic Neurologic evaluation reveals -alert and oriented x 3 with no impairment of recent or remote memory. Mental Status-Normal.  Musculoskeletal Normal Exam - Left-Upper Extremity Strength Normal and Lower Extremity Strength Normal. Normal Exam - Right-Upper Extremity Strength Normal and Lower Extremity Strength Normal.  Lymphatic Head & Neck  General Head & Neck Lymphatics: Bilateral - Description - Normal. Axillary  General Axillary Region: Bilateral - Description - Normal. Tenderness - Non Tender. Femoral & Inguinal  Generalized Femoral & Inguinal Lymphatics: Bilateral - Description - Normal. Tenderness - Non Tender.    Assessment & Plan (Mariadejesus Cade A. Fotini Lemus MD; 03/01/2020 12:18 PM)  PAPILLOMA OF LEFT BREAST (D24.2) Impression: Discussed lumpectomy versus observation. Pros and cons of each approach discussed. Discussed genetic testing and that she some point if she was interested given her family history. She would like to proceed with left breast lumpectomy hopefully next small point she returns from travel and is removed from the coagulation. Risk of malignancy in this case around 10 %. Discussed the pro and cons of genetic testing. She will think this over but wants to proceed with lumpectomy. Risk of lumpectomy include bleeding, infection, seroma, more surgery, use of seed/wire, wound care, cosmetic deformity and the need for other treatments, death , blood clots, death. Pt agrees to proceed.  total time 30 minutes  Current Plans You are being scheduled for surgery- Our schedulers will call you.  You should hear from our office's scheduling department within 5 working days about the location, date, and time of surgery. We try to make accommodations for patient's preferences in scheduling surgery, but sometimes the OR schedule or the surgeon's schedule  prevents [place] from making those accommodations.  If you have not heard from our office [phone]) in 5 working days, call the office and ask for your surgeon's nurse.  If you have other questions about your diagnosis, plan, or surgery, call the office and ask for your surgeon's nurse.  Pt Education - [facility] Breast Biopsy HCI: discussed with patient and provided information.

## 2020-04-22 NOTE — Anesthesia Procedure Notes (Signed)
Procedure Name: LMA Insertion Performed by: Floetta Brickey, Paige M, CRNA Pre-anesthesia Checklist: Patient identified, Emergency Drugs available, Suction available and Patient being monitored Patient Re-evaluated:Patient Re-evaluated prior to induction Oxygen Delivery Method: Circle system utilized Preoxygenation: Pre-oxygenation with 100% oxygen Induction Type: IV induction Ventilation: Mask ventilation without difficulty LMA: LMA inserted LMA Size: 4.0 Number of attempts: 1 Airway Equipment and Method: Bite block Placement Confirmation: positive ETCO2 Tube secured with: Tape Dental Injury: Teeth and Oropharynx as per pre-operative assessment        

## 2020-04-22 NOTE — Transfer of Care (Signed)
Immediate Anesthesia Transfer of Care Note  Patient: Stephanie Camacho  Procedure(s) Performed: LEFT BREAST LUMPECTOMY WITH RADIOACTIVE SEED LOCALIZATION (Left Breast)  Patient Location: PACU  Anesthesia Type:General  Level of Consciousness: awake, alert  and oriented  Airway & Oxygen Therapy: Patient Spontanous Breathing and Patient connected to face mask oxygen  Post-op Assessment: Report given to RN and Post -op Vital signs reviewed and stable  Post vital signs: Reviewed and stable  Last Vitals:  Vitals Value Taken Time  BP    Temp    Pulse 85 04/22/20 1137  Resp 0 04/22/20 1137  SpO2 97 % 04/22/20 1137  Vitals shown include unvalidated device data.  Last Pain:  Vitals:   04/22/20 1006  TempSrc: Oral  PainSc: 0-No pain      Patients Stated Pain Goal: 1 (46/80/32 1224)  Complications: No complications documented.

## 2020-04-22 NOTE — Discharge Instructions (Signed)
[place] Office Phone Number [phone]  BREAST BIOPSY/ PARTIAL MASTECTOMY: POST OP INSTRUCTIONS  Always review your discharge instruction sheet given to you by the facility where your surgery was performed.  IF YOU HAVE DISABILITY OR FAMILY LEAVE FORMS, YOU MUST BRING THEM TO THE OFFICE FOR PROCESSING.  DO NOT GIVE THEM TO YOUR DOCTOR.  1. A prescription for pain medication may be given to you upon discharge.  Take your pain medication as prescribed, if needed.  If narcotic pain medicine is not needed, then you may take acetaminophen (Tylenol) or ibuprofen (Advil) as needed. 2. Take your usually prescribed medications unless otherwise directed 3. If you need a refill on your pain medication, please contact your pharmacy.  They will contact our office to request authorization.  Prescriptions will not be filled after 5pm or on week-ends. 4. You should eat very light the first 24 hours after surgery, such as soup, crackers, pudding, etc.  Resume your normal diet the day after surgery. 5. Most patients will experience some swelling and bruising in the breast.  Ice packs and a good support bra will help.  Swelling and bruising can take several days to resolve.  6. It is common to experience some constipation if taking pain medication after surgery.  Increasing fluid intake and taking a stool softener will usually help or prevent this problem from occurring.  A mild laxative (Milk of Magnesia or Miralax) should be taken according to package directions if there are no bowel movements after 48 hours. 7. Unless discharge instructions indicate otherwise, you may remove your bandages 24-48 hours after surgery, and you may shower at that time.  You may have steri-strips (small skin tapes) in place directly over the incision.  These strips should be left on the skin for 7-10 days.  If your surgeon used skin glue on the incision, you may shower in 24 hours.  The glue will flake off over the  next 2-3 weeks.  Any sutures or staples will be removed at the office during your follow-up visit. 8. ACTIVITIES:  You may resume regular daily activities (gradually increasing) beginning the next day.  Wearing a good support bra or sports bra minimizes pain and swelling.  You may have sexual intercourse when it is comfortable. a. You may drive when you no longer are taking prescription pain medication, you can comfortably wear a seatbelt, and you can safely maneuver your car and apply brakes. b. RETURN TO WORK:  ______________________________________________________________________________________ 9. You should see your doctor in the office for a follow-up appointment approximately two weeks after your surgery.  Your doctor's nurse will typically make your follow-up appointment when she calls you with your pathology report.  Expect your pathology report 2-3 business days after your surgery.  You may call to check if you do not hear from [place] after three days. 10. OTHER INSTRUCTIONS: _______________________________________________________________________________________________ _____________________________________________________________________________________________________________________________________ _____________________________________________________________________________________________________________________________________ _____________________________________________________________________________________________________________________________________  WHEN TO CALL YOUR DOCTOR: 1. Fever over 101.0 2. Nausea and/or vomiting. 3. Extreme swelling or bruising. 4. Continued bleeding from incision. 5. Increased pain, redness, or drainage from the incision.  The clinic staff is available to answer your questions during regular business hours.  Please don't hesitate to call and ask to speak to one of the nurses for clinical concerns.  If you have a medical emergency, go to the nearest  emergency room or call 911.  A surgeon from [facility] is always on call at the hospital.  For further questions, please visit [email or web address]  Post Anesthesia Home Care Instructions  Activity: Get plenty of rest for the remainder of the day. A responsible individual must stay with you for 24 hours following the procedure.  For the next 24 hours, DO NOT: -Drive a car -Paediatric nurse -Drink alcoholic beverages -Take any medication unless instructed by your physician -Make any legal decisions or sign important papers.  Meals: Start with liquid foods such as gelatin or soup. Progress to regular foods as tolerated. Avoid greasy, spicy, heavy foods. If nausea and/or vomiting occur, drink only clear liquids until the nausea and/or vomiting subsides. Call your physician if vomiting continues.  Special Instructions/Symptoms: Your throat may feel dry or sore from the anesthesia or the breathing tube placed in your throat during surgery. If this causes discomfort, gargle with warm salt water. The discomfort should disappear within 24 hours.  If you had a scopolamine patch placed behind your ear for the management of post- operative nausea and/or vomiting:  1. The medication in the patch is effective for 72 hours, after which it should be removed.  Wrap patch in a tissue and discard in the trash. Wash hands thoroughly with soap and water. 2. You may remove the patch earlier than 72 hours if you experience unpleasant side effects which may include dry mouth, dizziness or visual disturbances. 3. Avoid touching the patch. Wash your hands with soap and water after contact with the patch.  No tylenol until today after 4pm if needed.

## 2020-04-22 NOTE — Anesthesia Preprocedure Evaluation (Addendum)
Anesthesia Evaluation  Patient identified by MRN, date of birth, ID band Patient awake    Reviewed: Allergy & Precautions, NPO status , Patient's Chart, lab work & pertinent test results  Airway Mallampati: II  TM Distance: >3 FB     Dental   Pulmonary asthma , sleep apnea ,    breath sounds clear to auscultation       Cardiovascular negative cardio ROS   Rhythm:Regular Rate:Normal     Neuro/Psych  Neuromuscular disease    GI/Hepatic negative GI ROS, Neg liver ROS,   Endo/Other  diabetes  Renal/GU negative Renal ROS     Musculoskeletal   Abdominal   Peds  Hematology   Anesthesia Other Findings   Reproductive/Obstetrics                            Anesthesia Physical Anesthesia Plan  ASA: II  Anesthesia Plan: General   Post-op Pain Management:    Induction: Intravenous  PONV Risk Score and Plan: 3 and Ondansetron, Dexamethasone and Midazolam  Airway Management Planned: LMA  Additional Equipment:   Intra-op Plan:   Post-operative Plan: Extubation in OR  Informed Consent: I have reviewed the patients History and Physical, chart, labs and discussed the procedure including the risks, benefits and alternatives for the proposed anesthesia with the patient or authorized representative who has indicated his/her understanding and acceptance.     Dental advisory given  Plan Discussed with: CRNA and Anesthesiologist  Anesthesia Plan Comments:         Anesthesia Quick Evaluation

## 2020-04-22 NOTE — Interval H&P Note (Signed)
History and Physical Interval Note:  04/22/2020 10:29 AM  Stephanie Camacho  has presented today for surgery, with the diagnosis of LEFT BREAST PAPILLOMA.  The various methods of treatment have been discussed with the patient and family. After consideration of risks, benefits and other options for treatment, the patient has consented to  Procedure(s): LEFT BREAST LUMPECTOMY WITH RADIOACTIVE SEED LOCALIZATION (Left) as a surgical intervention.  The patient's history has been reviewed, patient examined, no change in status, stable for surgery.  I have reviewed the patient's chart and labs.  Questions were answered to the patient's satisfaction.     [place]

## 2020-04-23 ENCOUNTER — Encounter (HOSPITAL_BASED_OUTPATIENT_CLINIC_OR_DEPARTMENT_OTHER): Payer: Self-pay | Admitting: Surgery

## 2020-04-23 LAB — SURGICAL PATHOLOGY

## 2020-04-26 DIAGNOSIS — M130 Polyarthritis, unspecified: Secondary | ICD-10-CM | POA: Diagnosis not present

## 2020-04-26 DIAGNOSIS — E785 Hyperlipidemia, unspecified: Secondary | ICD-10-CM | POA: Diagnosis not present

## 2020-04-27 NOTE — H&P (Signed)
Chief complaint: Right breast papilloma  HPI: Patient presents for right breast lumpectomy for right breast papilloma diagnosed on core biopsy.  Patient denies history of breast pain, nipple discharge or change in her breast.  Lesion identified on mammography and core biopsy.  Past medical history: Rheumatoid arthritis, class III severe obesity, bilateral plantar fasciitis  Past surgical history: No recent breast surgery  History of hysterectomy  Medications:DULOXETINE HCL PO  HYDROcodone-acetaminophen (NORCO/VICODIN) 5-325 MG tablet  JARDIANCE 25 MG TABS tablet  LANTUS SOLOSTAR 100 UNIT/ML Solostar Pen  OZEMPIC, 0.25 OR 0.5 MG/DOSE, 2 MG/1.5ML SOPN  VITAMIN D PO  meloxicam (MOBIC) 15 MG   Allergies: None  Family history: Noncontributory  Review of systems: Negative x8 systems   Exam:  Temperature 97.8 blood pressure 135/60 pulse 75  General appearance no apparent distress  H EENT: Extraocular wounds intact no jaundice  Neck: Neck supple nontender trachea midline  Breast: Right breast is normal.  Left breast was normal.  Pulmonary: Lung sounds are clear  Cardiovascular: Regular rhythm without rub murmur gallop  Abdomen: Soft nontender without rebound or guarding  Extremities no clubbing cyanosis or edema  Impression: Right breast papilloma diagnosed by core biopsy  Plan: Right breast seed localized lumpectomy given small but potential upgrade risk of up to 1520% to a more atypical annual malignant lesion.The procedure has been discussed with the patient. Alternatives to surgery have been discussed with the patient.  Risks of surgery include bleeding,  Infection,  Seroma formation, death,  and the need for further surgery.   The patient understands and wishes to proceed.

## 2020-04-28 ENCOUNTER — Encounter: Payer: Medicare Other | Admitting: Orthotics

## 2020-05-04 DIAGNOSIS — R0602 Shortness of breath: Secondary | ICD-10-CM | POA: Diagnosis not present

## 2020-05-04 DIAGNOSIS — Z7189 Other specified counseling: Secondary | ICD-10-CM | POA: Diagnosis not present

## 2020-05-04 DIAGNOSIS — E1169 Type 2 diabetes mellitus with other specified complication: Secondary | ICD-10-CM | POA: Diagnosis not present

## 2020-05-04 DIAGNOSIS — E785 Hyperlipidemia, unspecified: Secondary | ICD-10-CM | POA: Diagnosis not present

## 2020-05-04 DIAGNOSIS — E559 Vitamin D deficiency, unspecified: Secondary | ICD-10-CM | POA: Diagnosis not present

## 2020-05-04 DIAGNOSIS — G47419 Narcolepsy without cataplexy: Secondary | ICD-10-CM | POA: Diagnosis not present

## 2020-05-05 IMAGING — CR DG CHEST 2V
1 series · 2 of 2 positions shown · non-contrast
Comparison: None.

CLINICAL DATA: Shortness of breath.  Recent diagnosis of flu.

EXAM:
CHEST - 2 VIEW

[Series 1: dg chest 2 view · 0.14mm/px · 2 of 2 slices shown]
[im 1/2]
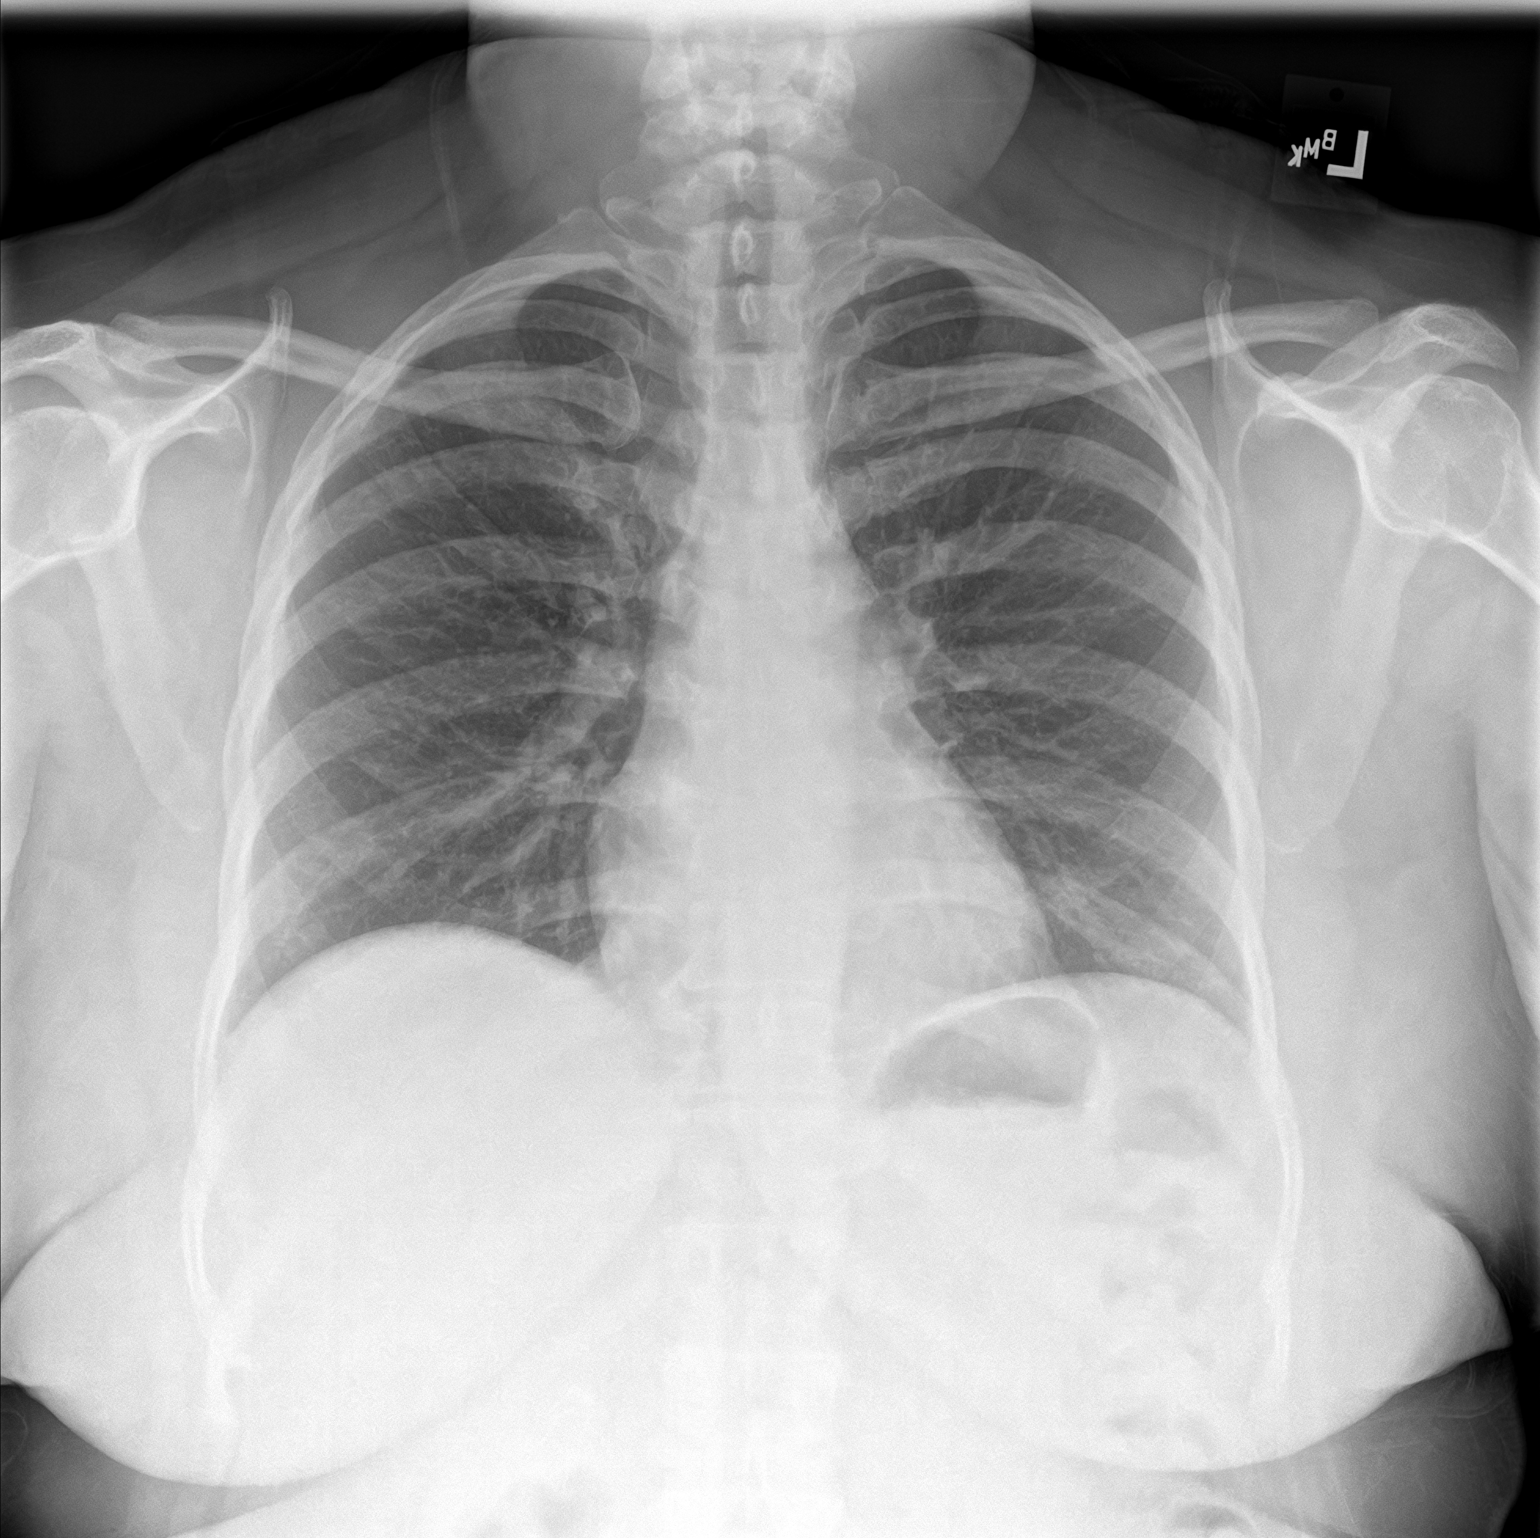
[im 2/2]
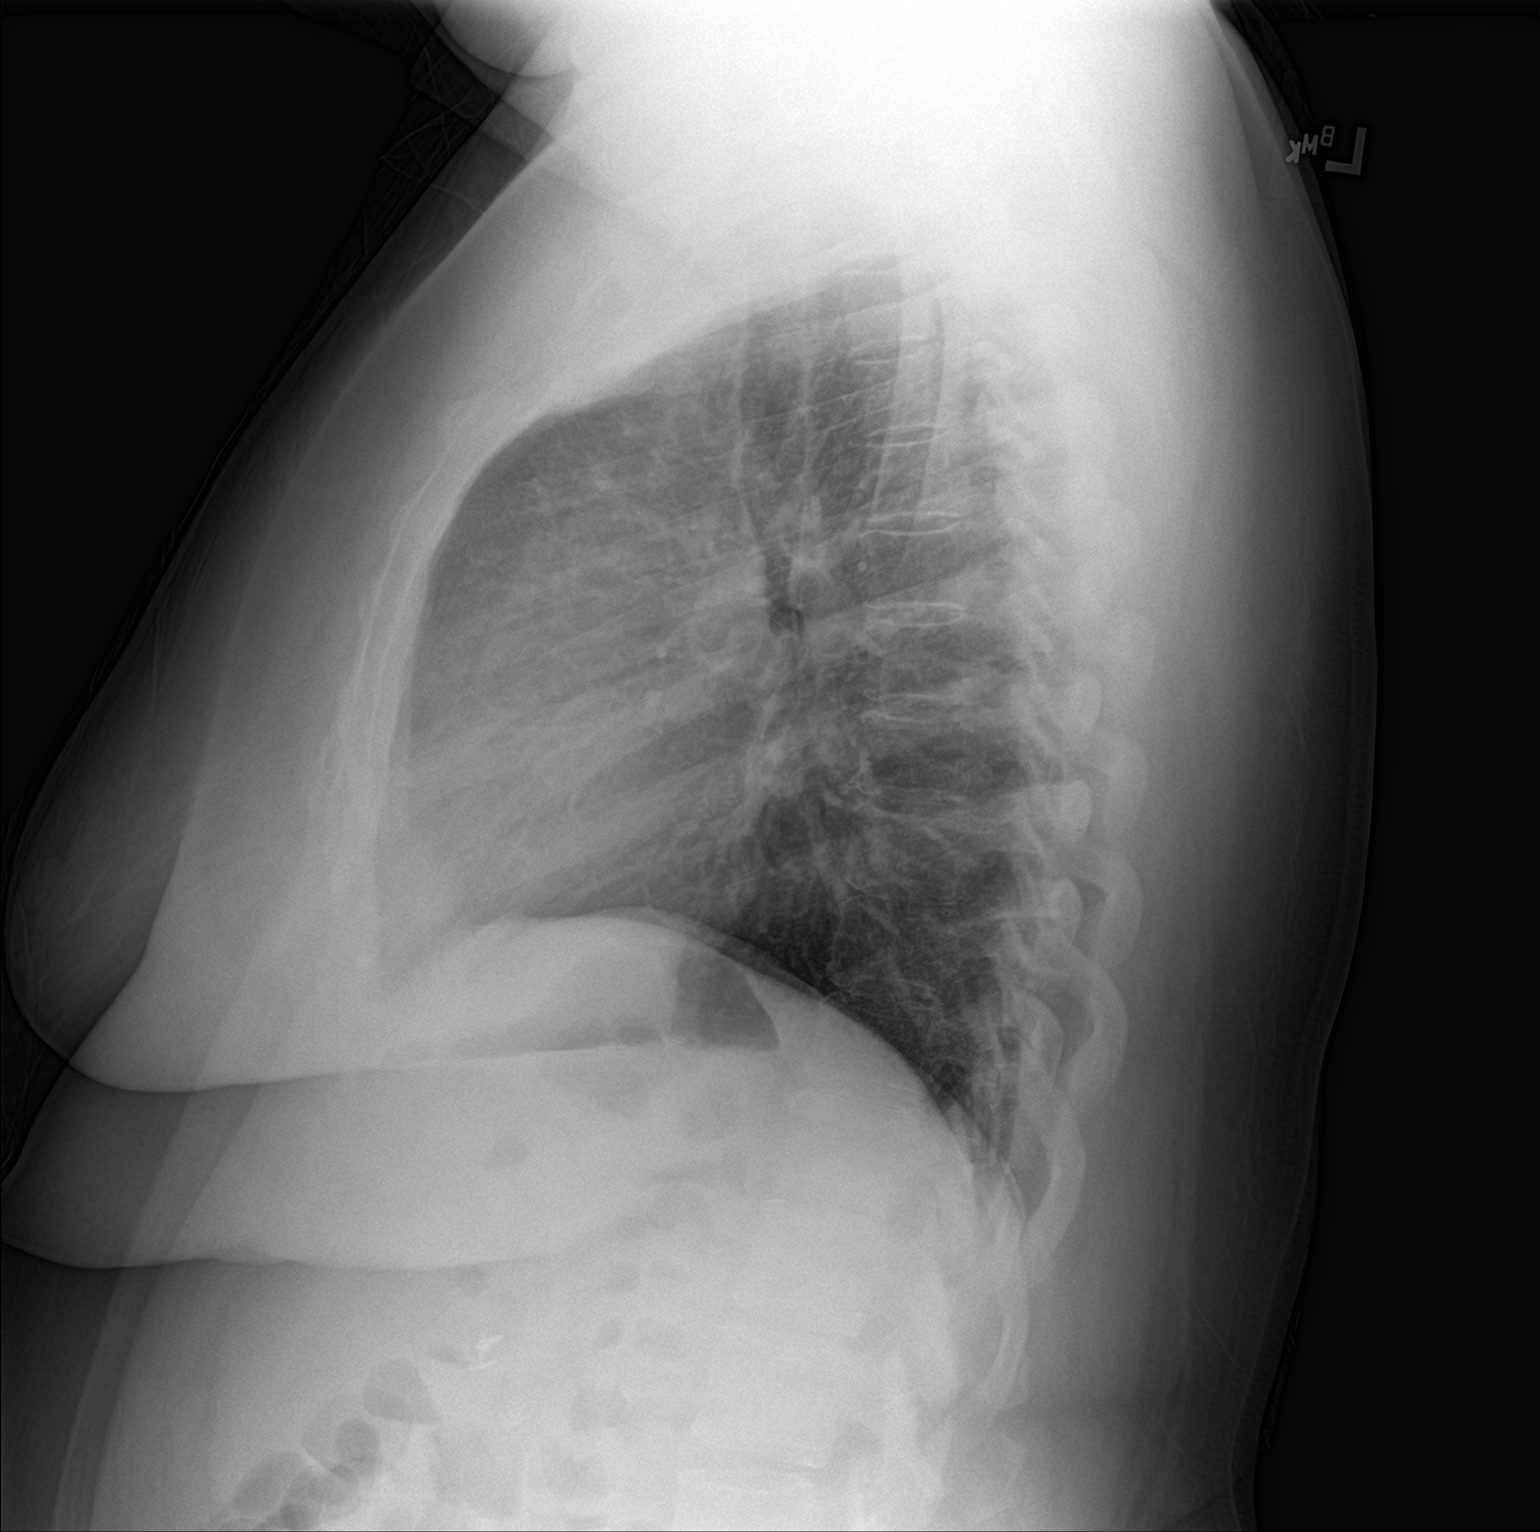

[2 of 2 positions shown; findings below may reference images not displayed]

FINDINGS: Patchy LEFT lung base airspace opacity. No pleural effusion.
Cardiomediastinal silhouette is normal. No pneumothorax. Soft tissue
planes and included osseous structures are normal. Surgical clips in
the included right abdomen compatible with cholecystectomy.
IMPRESSION: LEFT lung base atelectasis versus pneumonia.

## 2020-06-03 ENCOUNTER — Ambulatory Visit: Payer: Medicare Other

## 2020-06-07 ENCOUNTER — Other Ambulatory Visit: Payer: Self-pay

## 2020-06-07 ENCOUNTER — Ambulatory Visit (INDEPENDENT_AMBULATORY_CARE_PROVIDER_SITE_OTHER): Payer: Medicare Other | Admitting: Nurse Practitioner

## 2020-06-07 ENCOUNTER — Ambulatory Visit
Admission: RE | Admit: 2020-06-07 | Discharge: 2020-06-07 | Disposition: A | Discharge status: Home / Self Care | Payer: Medicare Other | Source: Ambulatory Visit | Attending: Nurse Practitioner | Admitting: Nurse Practitioner

## 2020-06-07 VITALS — BP 136/81 | HR 89 | Temp 97.9°F | Resp 18

## 2020-06-07 DIAGNOSIS — R269 Unspecified abnormalities of gait and mobility: Secondary | ICD-10-CM | POA: Diagnosis not present

## 2020-06-07 DIAGNOSIS — R0602 Shortness of breath: Secondary | ICD-10-CM | POA: Diagnosis not present

## 2020-06-07 DIAGNOSIS — R5381 Other malaise: Secondary | ICD-10-CM | POA: Diagnosis not present

## 2020-06-07 DIAGNOSIS — J4520 Mild intermittent asthma, uncomplicated: Secondary | ICD-10-CM

## 2020-06-07 DIAGNOSIS — Z8616 Personal history of COVID-19: Secondary | ICD-10-CM | POA: Diagnosis not present

## 2020-06-07 MED ORDER — BUDESONIDE-FORMOTEROL FUMARATE 80-4.5 MCG/ACT IN AERO: 2.0000 | INHALATION_SPRAY | Freq: Two times a day (BID) | RESPIRATORY_TRACT | 3 refills | Status: AC

## 2020-06-07 MED ORDER — MONTELUKAST SODIUM 10 MG PO TABS: 10.0000 mg | ORAL_TABLET | Freq: Every day | ORAL | 3 refills | Status: AC

## 2020-06-07 NOTE — Progress Notes (Signed)
'@Patient'  ID: Stephanie Camacho, female    DOB: 02-Dec-1965, 55 y.o.   MRN: [id number]  Chief Complaint  Patient presents with  . history of covid    Covid in August 2021 - still having shortness of breath and balance issues.     Referring provider: Lucianne Lei, MD   HPI  Patient presents today for post COVID care clinic visit.  Patient was diagnosed with Covid in August 2021 and was hospitalized for lower extremity DVT and Covid pneumonia at that time.  Patient was placed on Eliquis and this has recently been DC'd by PCP after 6 months of therapy.  Patient continues to have issues with shortness of breath with exertion.  She is now walking with a cane or walker.  Before she had Covid she was getting around without any assistance.  She states that it is hard to walk to her mailbox without getting short of breath.  She can no longer wash dishes without having to take breaks.  She has not had any follow-up imaging or physical therapy since discharge from the hospital.  Patient was walked in the office today O2 sats and heart rate remained stable throughout the walk.  Patient was very limited due to physical deconditioning and could not walk very far.  We discussed that she will need physical therapy to strengthen her heart and lungs as well as build her physical endurance.  Patient does have a history of asthma and we discussed that we can start her on Symbicort and Singulair.  We will get repeat imaging today. Denies f/c/s, n/v/d, hemoptysis, PND, chest pain or edema.      No Known Allergies  Immunization History  Administered Date(s) Administered  . Influenza-Unspecified 12/28/2017    Past Medical History:  Diagnosis Date  . Arthritis   . Asthma   . Diabetes mellitus without complication ([place])   . DVT, bilateral lower limbs ([place])    post covid infection  . Fibromyalgia   . History of COVID-19 10/13/2019   [facility] ([facility] week stay)  . Sleep apnea    no cpap     Tobacco History: Social History   Tobacco Use  Smoking Status Never Smoker  Smokeless Tobacco Never Used   Counseling given: Yes   Outpatient Encounter Medications as of 06/07/2020  Medication Sig  . budesonide-formoterol (SYMBICORT) 80-4.5 MCG/ACT inhaler Inhale 2 puffs into the lungs 2 (two) times daily.  . montelukast (SINGULAIR) 10 MG tablet Take 1 tablet (10 mg total) by mouth at bedtime.  Marland Kitchen ACCU-CHEK AVIVA PLUS test strip   . Accu-Chek FastClix Lancets MISC   . Blood Glucose Monitoring Suppl (ACCU-CHEK GUIDE ME) w/Device KIT USE UTD BID  . DULOXETINE HCL PO Take by mouth.  Marland Kitchen HYDROcodone-acetaminophen (NORCO/VICODIN) 5-325 MG tablet Take 1 tablet by mouth every 6 (six) hours as needed for moderate pain.  Marland Kitchen JARDIANCE 25 MG TABS tablet   . LANTUS SOLOSTAR 100 UNIT/ML Solostar Pen Inject 10 Units into the skin at bedtime.  . meloxicam (MOBIC) 15 MG tablet Take 1 tablet (15 mg total) by mouth daily.  Marland Kitchen OZEMPIC, 0.25 OR 0.5 MG/DOSE, 2 MG/1.5ML SOPN   . VITAMIN D PO Take by mouth.   No facility-administered encounter medications on file as of 06/07/2020.     Review of Systems  Review of Systems  Constitutional: Positive for activity change and fatigue. Negative for fever.  HENT: Negative.   Respiratory: Positive for cough and shortness of breath.   Cardiovascular: Negative.  Negative for chest pain, palpitations and leg swelling.  Gastrointestinal: Negative.   Musculoskeletal: Positive for gait problem.  Allergic/Immunologic: Negative.   Psychiatric/Behavioral: Negative.        Physical Exam  BP 136/81   Pulse 89   Temp 97.9 F (36.6 C)   Resp 18   SpO2 99%   Wt Readings from Last 5 Encounters:  04/22/20 272 lb 0.8 oz (123.4 kg)  02/02/18 263 lb (119.3 kg)  02/01/18 263 lb (119.3 kg)  10/25/14 277 lb (125.6 kg)     Physical Exam Vitals and nursing note reviewed.  Constitutional:      General: She is not in acute distress.    Appearance: She is  well-developed.  Cardiovascular:     Rate and Rhythm: Normal rate and regular rhythm.  Pulmonary:     Effort: Pulmonary effort is normal.     Breath sounds: Normal breath sounds.  Musculoskeletal:     Right lower leg: No edema.     Left lower leg: No edema.  Neurological:     Mental Status: She is alert and oriented to person, place, and time.     Gait: Gait abnormal.  Psychiatric:        Mood and Affect: Mood normal.        Behavior: Behavior normal.        Assessment & Plan:   History of COVID-19 Shortness of breath with exertion Gait disturbance Physical deconditioning:   Stay well hydrated  Stay active  Deep breathing exercises  May take tylenol or fever or pain  Will order Symbicort - 2 puffs twice daily  Will order Singulair - daily at bedtime    Will order chest x ray:  [facility] [address], [place] [address] [phone] MON - FRI 8:00 AM - 4:00 PM - WALK IN   Follow up:  Follow up in 2 weeks or sooner if needed      Fenton Foy, NP 06/07/2020

## 2020-06-07 NOTE — Patient Instructions (Signed)
History of Covid 19 Shortness of breath with exertion Gait disturbance Physical deconditioning:   Stay well hydrated  Stay active  Deep breathing exercises  May take tylenol or fever or pain  Will order Symbicort - 2 puffs twice daily  Will order Singulair - daily at bedtime    Will order chest x ray:  [facility] [address], [place] [address] [phone] MON - FRI 8:00 AM - 4:00 PM - WALK IN   Follow up:  Follow up in 2 weeks or sooner if needed

## 2020-06-07 NOTE — Assessment & Plan Note (Signed)
Shortness of breath with exertion Gait disturbance Physical deconditioning:   Stay well hydrated  Stay active  Deep breathing exercises  May take tylenol or fever or pain  Will order Symbicort - 2 puffs twice daily  Will order Singulair - daily at bedtime    Will order chest x ray:  [facility] [address], [place] [address] [phone] MON - FRI 8:00 AM - 4:00 PM - WALK IN   Follow up:  Follow up in 2 weeks or sooner if needed

## 2020-06-16 ENCOUNTER — Ambulatory Visit: Payer: Medicare Other

## 2020-07-07 ENCOUNTER — Ambulatory Visit: Payer: Medicare Other | Admitting: Nurse Practitioner

## 2020-07-19 DIAGNOSIS — L720 Epidermal cyst: Secondary | ICD-10-CM | POA: Diagnosis not present

## 2020-07-19 DIAGNOSIS — L723 Sebaceous cyst: Secondary | ICD-10-CM | POA: Diagnosis not present

## 2020-08-04 ENCOUNTER — Encounter: Payer: Self-pay | Admitting: Podiatry

## 2020-08-04 ENCOUNTER — Ambulatory Visit (INDEPENDENT_AMBULATORY_CARE_PROVIDER_SITE_OTHER): Payer: Medicare Other

## 2020-08-04 ENCOUNTER — Other Ambulatory Visit: Payer: Self-pay

## 2020-08-04 ENCOUNTER — Ambulatory Visit (INDEPENDENT_AMBULATORY_CARE_PROVIDER_SITE_OTHER): Payer: Medicare Other | Admitting: Podiatry

## 2020-08-04 VITALS — BP 129/80 | HR 82 | Temp 98.8°F | Resp 16

## 2020-08-04 DIAGNOSIS — M2041 Other hammer toe(s) (acquired), right foot: Secondary | ICD-10-CM | POA: Diagnosis not present

## 2020-08-04 DIAGNOSIS — M778 Other enthesopathies, not elsewhere classified: Secondary | ICD-10-CM

## 2020-08-04 DIAGNOSIS — M069 Rheumatoid arthritis, unspecified: Secondary | ICD-10-CM | POA: Diagnosis not present

## 2020-08-04 MED ORDER — TRIAMCINOLONE ACETONIDE 40 MG/ML IJ SUSP
20.0000 mg | Freq: Once | INTRAMUSCULAR | Status: AC
  Administered 2020-08-04: 20 mg

## 2020-08-04 NOTE — Progress Notes (Signed)
She presents today for a chief complaint of pain and swelling to her right foot.  She is on FaceTime with her daughter so her daughter can account for some of the problems as well.  She denies change in her past medical history medications allergies surgeries social history is that she does have some type of rheumatic disease but does not know what kind because the rheumatologist has failed to diagnose it completely.  She states that her foot was red and swollen particular around the second third and fourth metatarsophalangeal joints and is now somewhat numb in those areas.  Objective: Vital signs are stable alert oriented x3 pulses are palpable.  She has pain on palpation and range of motion of the second and third metatarsophalangeal joints of the right foot.  Radiographs taken today do not demonstrate any type of osseous abnormalities no fractures that I can see at this time.  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  Plan: I injected the area today with Kenalog and local anesthetic.  I am going to request some arthritic panel and and the recommended appropriate shoe gear.  Should her blood work come back abnormal we will notify her immediately.

## 2020-08-05 LAB — URIC ACID: Uric Acid: 5.4 mg/dL (ref 3.0–7.2)

## 2020-08-05 LAB — CBC WITH DIFFERENTIAL/PLATELET
Basophils Absolute: 0.1 10*3/uL (ref 0.0–0.2)
Basos: 1 %
EOS (ABSOLUTE): 0.1 10*3/uL (ref 0.0–0.4)
Eos: 1 %
Hematocrit: 43.8 % (ref 34.0–46.6)
Hemoglobin: 14 g/dL (ref 11.1–15.9)
Immature Grans (Abs): 0 10*3/uL (ref 0.0–0.1)
Immature Granulocytes: 0 %
Lymphocytes Absolute: 3.2 10*3/uL — ABNORMAL HIGH (ref 0.7–3.1)
Lymphs: 26 %
MCH: 25.3 pg — ABNORMAL LOW (ref 26.6–33.0)
MCHC: 32 g/dL (ref 31.5–35.7)
MCV: 79 fL (ref 79–97)
Monocytes Absolute: 0.7 10*3/uL (ref 0.1–0.9)
Monocytes: 6 %
Neutrophils Absolute: 7.8 10*3/uL — ABNORMAL HIGH (ref 1.4–7.0)
Neutrophils: 66 %
Platelets: 352 10*3/uL (ref 150–450)
RBC: 5.54 x10E6/uL — ABNORMAL HIGH (ref 3.77–5.28)
RDW: 14.4 % (ref 11.7–15.4)
WBC: 11.9 10*3/uL — ABNORMAL HIGH (ref 3.4–10.8)

## 2020-08-05 LAB — SEDIMENTATION RATE: Sed Rate: 64 mm/hr — ABNORMAL HIGH (ref 0–40)

## 2020-08-05 LAB — RHEUMATOID FACTOR: Rheumatoid fact SerPl-aCnc: <10 IU/mL (ref <14.0)

## 2020-08-05 LAB — C-REACTIVE PROTEIN: CRP: 15 mg/L — ABNORMAL HIGH (ref 0–10)

## 2020-08-05 LAB — ANA: Anti Nuclear Antibody (ANA): NEGATIVE

## 2020-08-12 ENCOUNTER — Other Ambulatory Visit: Payer: Self-pay

## 2020-08-12 ENCOUNTER — Telehealth: Payer: Self-pay

## 2020-08-12 MED ORDER — SULFAMETHOXAZOLE-TRIMETHOPRIM 800-160 MG PO TABS: 1.0000 | ORAL_TABLET | Freq: Two times a day (BID) | ORAL | 0 refills | Status: AC

## 2020-08-12 NOTE — Telephone Encounter (Signed)
Pt was called today at 543pm and informed that her WBC is elevated. I order the pt Bactrim DS 800 BID for 10 days. Pt stated that her foot is still sore on the dorsal area and plantar area. There is no swollen and no fever. Pt is schedule to be seen on 08/18/20 at 215 pm.  Sindy Messing RN, BSN

## 2020-08-18 ENCOUNTER — Ambulatory Visit (INDEPENDENT_AMBULATORY_CARE_PROVIDER_SITE_OTHER): Payer: Medicare Other | Admitting: Podiatry

## 2020-08-18 ENCOUNTER — Encounter: Payer: Self-pay | Admitting: Podiatry

## 2020-08-18 ENCOUNTER — Other Ambulatory Visit: Payer: Self-pay

## 2020-08-18 DIAGNOSIS — M778 Other enthesopathies, not elsewhere classified: Secondary | ICD-10-CM | POA: Diagnosis not present

## 2020-08-18 MED ORDER — TRIAMCINOLONE ACETONIDE 40 MG/ML IJ SUSP
20.0000 mg | Freq: Once | INTRAMUSCULAR | Status: AC
  Administered 2020-08-18: 20 mg

## 2020-08-18 NOTE — Progress Notes (Signed)
She presents today for follow-up of her capsulitis third metatarsophalangeal joint of her right foot.  States that hurts right here but everything else is doing much better.  Objective: Vital signs are stable she alert oriented x3 severe hallux limitus first metatarsophalangeal joint of the right foot resulting in lateralization and more pressure on the third metatarsal phalangeal joint area.  Assessment: Hallux rigidus with capsulitis of the lesser metatarsal phalangeal joints.  Plan: Injected dexamethasone plantar aspect third metatarsal phalangeal joint of the right foot to the point maximal tenderness.  Discussed the need for surgical intervention regarding Keller arthroplasty single silicone implant right foot.  She will follow-up with me

## 2020-09-16 DIAGNOSIS — L723 Sebaceous cyst: Secondary | ICD-10-CM | POA: Diagnosis not present

## 2020-09-16 DIAGNOSIS — L720 Epidermal cyst: Secondary | ICD-10-CM | POA: Diagnosis not present

## 2020-09-26 DIAGNOSIS — M130 Polyarthritis, unspecified: Secondary | ICD-10-CM | POA: Diagnosis not present

## 2020-09-26 DIAGNOSIS — E785 Hyperlipidemia, unspecified: Secondary | ICD-10-CM | POA: Diagnosis not present

## 2020-09-26 DIAGNOSIS — E1169 Type 2 diabetes mellitus with other specified complication: Secondary | ICD-10-CM | POA: Diagnosis not present

## 2020-09-27 ENCOUNTER — Ambulatory Visit: Payer: Medicare Other | Admitting: Podiatry

## 2020-10-14 ENCOUNTER — Other Ambulatory Visit: Payer: Self-pay | Admitting: Nurse Practitioner

## 2020-10-14 DIAGNOSIS — J4520 Mild intermittent asthma, uncomplicated: Secondary | ICD-10-CM

## 2020-10-20 ENCOUNTER — Other Ambulatory Visit: Payer: Self-pay

## 2020-10-20 ENCOUNTER — Encounter: Payer: Self-pay | Admitting: Podiatry

## 2020-10-20 ENCOUNTER — Ambulatory Visit (INDEPENDENT_AMBULATORY_CARE_PROVIDER_SITE_OTHER): Payer: Medicare Other | Admitting: Podiatry

## 2020-10-20 DIAGNOSIS — M722 Plantar fascial fibromatosis: Secondary | ICD-10-CM | POA: Diagnosis not present

## 2020-10-20 MED ORDER — TRIAMCINOLONE ACETONIDE 40 MG/ML IJ SUSP
40.0000 mg | Freq: Once | INTRAMUSCULAR | Status: AC
  Administered 2020-10-20: 40 mg

## 2020-10-20 NOTE — Progress Notes (Signed)
She presents today for follow-up of her capsulitis.  States that she has had some swelling in her ankles recently.  States that the forefoot is not hurting at all.  States the majority of her tenderness is over the lateral aspect of the foot and around the ankles.  Objective: Vital signs are stable is alert oriented x3.  She has no pain on palpation of the forefoot right that she does have tenderness on palpation of the plantar fascial bilateral.  Assessment: Chronic intractable Planter fasciitis most likely resulting in lateral compensatory syndrome and even some of the forefoot pathologies.  Plan: Reinjected the bilateral heels today Kenalog 20 mg and 5 mg of Marcaine.  Tolerated procedure well.  Follow-up with her in a few weeks.  Provided her with night splints.

## 2020-10-27 DIAGNOSIS — H6121 Impacted cerumen, right ear: Secondary | ICD-10-CM | POA: Diagnosis not present

## 2020-10-27 DIAGNOSIS — L299 Pruritus, unspecified: Secondary | ICD-10-CM | POA: Diagnosis not present

## 2020-10-27 DIAGNOSIS — J329 Chronic sinusitis, unspecified: Secondary | ICD-10-CM | POA: Diagnosis not present

## 2020-11-11 ENCOUNTER — Ambulatory Visit: Payer: Medicare Other

## 2020-11-18 DIAGNOSIS — Z1211 Encounter for screening for malignant neoplasm of colon: Secondary | ICD-10-CM | POA: Diagnosis not present

## 2020-11-24 DIAGNOSIS — J301 Allergic rhinitis due to pollen: Secondary | ICD-10-CM | POA: Diagnosis not present

## 2020-11-26 DIAGNOSIS — E785 Hyperlipidemia, unspecified: Secondary | ICD-10-CM | POA: Diagnosis not present

## 2020-11-26 DIAGNOSIS — E1169 Type 2 diabetes mellitus with other specified complication: Secondary | ICD-10-CM | POA: Diagnosis not present

## 2020-11-26 DIAGNOSIS — M130 Polyarthritis, unspecified: Secondary | ICD-10-CM | POA: Diagnosis not present

## 2020-12-22 ENCOUNTER — Other Ambulatory Visit: Payer: Self-pay

## 2020-12-22 ENCOUNTER — Ambulatory Visit (INDEPENDENT_AMBULATORY_CARE_PROVIDER_SITE_OTHER): Payer: Medicare Other | Admitting: Podiatry

## 2020-12-22 DIAGNOSIS — M722 Plantar fascial fibromatosis: Secondary | ICD-10-CM

## 2020-12-22 MED ORDER — TRIAMCINOLONE ACETONIDE 40 MG/ML IJ SUSP
20.0000 mg | Freq: Once | INTRAMUSCULAR | Status: AC
  Administered 2020-12-22: 20 mg

## 2020-12-22 MED ORDER — GABAPENTIN 100 MG PO CAPS: 100.0000 mg | ORAL_CAPSULE | Freq: Two times a day (BID) | ORAL | 3 refills | Status: AC

## 2020-12-22 NOTE — Progress Notes (Signed)
She presents today states that the left is still hurting but the right is feeling pretty good she is having spasms in both legs.  Objective: Vital signs are stable she is alert and oriented x3.  Pulses are palpable.  She has pain on palpation MucoClear tubercle of the left heel not so much on the right.  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  Plan fasciitis resolving right painful left.  Plan: Reinjected the left today 20 mg Kenalog 5 mg Marcaine we will follow-up with her in 1 month and I did start her on gabapentin 100 mg in the morning and at night.

## 2021-02-02 ENCOUNTER — Ambulatory Visit: Payer: Medicare Other | Admitting: Podiatry

## 2021-02-25 DIAGNOSIS — M130 Polyarthritis, unspecified: Secondary | ICD-10-CM | POA: Diagnosis not present

## 2021-02-25 DIAGNOSIS — E785 Hyperlipidemia, unspecified: Secondary | ICD-10-CM | POA: Diagnosis not present

## 2021-02-25 DIAGNOSIS — E1169 Type 2 diabetes mellitus with other specified complication: Secondary | ICD-10-CM | POA: Diagnosis not present

## 2021-03-27 DIAGNOSIS — E785 Hyperlipidemia, unspecified: Secondary | ICD-10-CM | POA: Diagnosis not present

## 2021-03-27 DIAGNOSIS — E1169 Type 2 diabetes mellitus with other specified complication: Secondary | ICD-10-CM | POA: Diagnosis not present

## 2021-03-27 DIAGNOSIS — M130 Polyarthritis, unspecified: Secondary | ICD-10-CM | POA: Diagnosis not present

## 2021-04-02 DIAGNOSIS — E559 Vitamin D deficiency, unspecified: Secondary | ICD-10-CM | POA: Diagnosis not present

## 2021-04-02 DIAGNOSIS — E785 Hyperlipidemia, unspecified: Secondary | ICD-10-CM | POA: Diagnosis not present

## 2021-04-02 DIAGNOSIS — Z23 Encounter for immunization: Secondary | ICD-10-CM | POA: Diagnosis not present

## 2021-04-02 DIAGNOSIS — I10 Essential (primary) hypertension: Secondary | ICD-10-CM | POA: Diagnosis not present

## 2021-04-02 DIAGNOSIS — E1169 Type 2 diabetes mellitus with other specified complication: Secondary | ICD-10-CM | POA: Diagnosis not present

## 2021-04-02 DIAGNOSIS — J45909 Unspecified asthma, uncomplicated: Secondary | ICD-10-CM | POA: Diagnosis not present

## 2021-04-27 ENCOUNTER — Ambulatory Visit: Payer: Medicare Other | Admitting: Podiatry

## 2021-04-27 DIAGNOSIS — E1165 Type 2 diabetes mellitus with hyperglycemia: Secondary | ICD-10-CM | POA: Diagnosis not present

## 2021-04-27 DIAGNOSIS — E559 Vitamin D deficiency, unspecified: Secondary | ICD-10-CM | POA: Diagnosis not present

## 2021-04-27 DIAGNOSIS — E785 Hyperlipidemia, unspecified: Secondary | ICD-10-CM | POA: Diagnosis not present

## 2021-05-04 ENCOUNTER — Ambulatory Visit (INDEPENDENT_AMBULATORY_CARE_PROVIDER_SITE_OTHER): Payer: Medicare Other | Admitting: Podiatry

## 2021-05-04 ENCOUNTER — Other Ambulatory Visit: Payer: Self-pay

## 2021-05-04 ENCOUNTER — Encounter: Payer: Self-pay | Admitting: Podiatry

## 2021-05-04 DIAGNOSIS — D2371 Other benign neoplasm of skin of right lower limb, including hip: Secondary | ICD-10-CM | POA: Diagnosis not present

## 2021-05-04 DIAGNOSIS — M722 Plantar fascial fibromatosis: Secondary | ICD-10-CM

## 2021-05-04 DIAGNOSIS — G5781 Other specified mononeuropathies of right lower limb: Secondary | ICD-10-CM

## 2021-05-04 MED ORDER — TRIAMCINOLONE ACETONIDE 40 MG/ML IJ SUSP
40.0000 mg | Freq: Once | INTRAMUSCULAR | Status: AC
  Administered 2021-05-04: 40 mg

## 2021-05-04 NOTE — Progress Notes (Signed)
She presents today chief complaint of pain to the left heel and pain to the ball of her right foot.  States that the right foot feels like an electrical shock coming from between her third and fourth toes. ? ?Objective: Vital signs stable alert oriented x3.  Pulses are palpable.  She has a palpable neuroma third interspace of the right foot pain on palpation MucoClear tubercle of her left heel. ? ?Assessment: Pain in limb secondary to neuroma and chronic intractable Planter fasciitis. ? ?Plan: Injected the left heel today with Kenalog and local anesthetic 20 mg.  Injected the neuroma third interspace of the right foot with 10 mg.  Follow-up with her in 1 month ?

## 2021-06-07 DIAGNOSIS — E1169 Type 2 diabetes mellitus with other specified complication: Secondary | ICD-10-CM | POA: Diagnosis not present

## 2021-06-07 DIAGNOSIS — E559 Vitamin D deficiency, unspecified: Secondary | ICD-10-CM | POA: Diagnosis not present

## 2021-06-07 DIAGNOSIS — E785 Hyperlipidemia, unspecified: Secondary | ICD-10-CM | POA: Diagnosis not present

## 2021-06-26 DIAGNOSIS — E785 Hyperlipidemia, unspecified: Secondary | ICD-10-CM | POA: Diagnosis not present

## 2021-06-26 DIAGNOSIS — E1169 Type 2 diabetes mellitus with other specified complication: Secondary | ICD-10-CM | POA: Diagnosis not present

## 2021-07-07 ENCOUNTER — Encounter (HOSPITAL_COMMUNITY): Payer: Self-pay

## 2021-07-20 DIAGNOSIS — R928 Other abnormal and inconclusive findings on diagnostic imaging of breast: Secondary | ICD-10-CM | POA: Diagnosis not present

## 2021-07-27 DIAGNOSIS — E785 Hyperlipidemia, unspecified: Secondary | ICD-10-CM | POA: Diagnosis not present

## 2021-07-27 DIAGNOSIS — E1169 Type 2 diabetes mellitus with other specified complication: Secondary | ICD-10-CM | POA: Diagnosis not present

## 2021-08-26 DIAGNOSIS — E785 Hyperlipidemia, unspecified: Secondary | ICD-10-CM | POA: Diagnosis not present

## 2021-08-26 DIAGNOSIS — E1169 Type 2 diabetes mellitus with other specified complication: Secondary | ICD-10-CM | POA: Diagnosis not present

## 2022-01-10 ENCOUNTER — Telehealth: Payer: Self-pay | Admitting: *Deleted

## 2022-01-10 ENCOUNTER — Ambulatory Visit (INDEPENDENT_AMBULATORY_CARE_PROVIDER_SITE_OTHER): Payer: Medicare Other | Admitting: Podiatry

## 2022-01-10 DIAGNOSIS — M722 Plantar fascial fibromatosis: Secondary | ICD-10-CM

## 2022-01-10 DIAGNOSIS — G5781 Other specified mononeuropathies of right lower limb: Secondary | ICD-10-CM

## 2022-01-10 MED ORDER — TRIAMCINOLONE ACETONIDE 40 MG/ML IJ SUSP
40.0000 mg | Freq: Once | INTRAMUSCULAR | Status: AC
  Administered 2022-01-10: 40 mg

## 2022-01-10 NOTE — Telephone Encounter (Signed)
Patient was given handicap placard today , approved by physician-01/10/22

## 2022-01-10 NOTE — Progress Notes (Signed)
Ryelynn presents today states that the ball of her right foot started swelling just a few days ago feels like she is walking on something on the bottom.  She states that her big toe still designated on the right foot and considering surgery for the arthritis.  She states that her left heel hurts the worst.  Objective: Vital signs are stable alert and oriented x3.  There is no erythema edema cellulitis drainage or odor.  Pulses are palpable.  She does have some mild edema to the right foot however she does have tenderness on palpation to the third interdigital space and on range of motion of the toes #3 #4 of that right foot.  Left heel is exquisitely painful on palpation.  No pain on medial-lateral compression of the calcaneus.  Assessment: Chronic proximal Planter fasciitis left heel.  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  Plan: Discussed etiology pathology conservative versus surgical therapies.  I injected both areas today 20 mg Kenalog milligrams Marcaine point maximal tenderness.  Discussed appropriate shoe gear discussed the possible need for surgical intervention regarding the first metatarsophalangeal joint to help reduce the pressure on the lateral aspect of the foot.  She understands and is amenable to it.

## 2022-01-31 DIAGNOSIS — Z0000 Encounter for general adult medical examination without abnormal findings: Secondary | ICD-10-CM | POA: Diagnosis not present

## 2022-01-31 DIAGNOSIS — Z794 Long term (current) use of insulin: Secondary | ICD-10-CM | POA: Diagnosis not present

## 2022-01-31 DIAGNOSIS — J45909 Unspecified asthma, uncomplicated: Secondary | ICD-10-CM | POA: Diagnosis not present

## 2022-01-31 DIAGNOSIS — E782 Mixed hyperlipidemia: Secondary | ICD-10-CM | POA: Diagnosis not present

## 2022-01-31 DIAGNOSIS — E1169 Type 2 diabetes mellitus with other specified complication: Secondary | ICD-10-CM | POA: Diagnosis not present

## 2022-01-31 DIAGNOSIS — E559 Vitamin D deficiency, unspecified: Secondary | ICD-10-CM | POA: Diagnosis not present

## 2022-02-08 DIAGNOSIS — H2513 Age-related nuclear cataract, bilateral: Secondary | ICD-10-CM | POA: Diagnosis not present

## 2022-02-09 DIAGNOSIS — H5213 Myopia, bilateral: Secondary | ICD-10-CM | POA: Diagnosis not present

## 2022-04-10 DIAGNOSIS — H524 Presbyopia: Secondary | ICD-10-CM | POA: Diagnosis not present

## 2022-05-02 DIAGNOSIS — E782 Mixed hyperlipidemia: Secondary | ICD-10-CM | POA: Diagnosis not present

## 2022-05-02 DIAGNOSIS — E1165 Type 2 diabetes mellitus with hyperglycemia: Secondary | ICD-10-CM | POA: Diagnosis not present

## 2022-05-02 DIAGNOSIS — J45909 Unspecified asthma, uncomplicated: Secondary | ICD-10-CM | POA: Diagnosis not present

## 2022-05-02 DIAGNOSIS — E559 Vitamin D deficiency, unspecified: Secondary | ICD-10-CM | POA: Diagnosis not present

## 2022-06-06 DIAGNOSIS — E1169 Type 2 diabetes mellitus with other specified complication: Secondary | ICD-10-CM | POA: Diagnosis not present

## 2022-06-06 DIAGNOSIS — E1165 Type 2 diabetes mellitus with hyperglycemia: Secondary | ICD-10-CM | POA: Diagnosis not present

## 2022-07-04 ENCOUNTER — Encounter: Payer: Self-pay | Admitting: Podiatry

## 2022-07-04 ENCOUNTER — Ambulatory Visit: Payer: 59 | Admitting: Podiatry

## 2022-07-04 ENCOUNTER — Ambulatory Visit (INDEPENDENT_AMBULATORY_CARE_PROVIDER_SITE_OTHER): Payer: 59 | Admitting: Podiatry

## 2022-07-04 DIAGNOSIS — M722 Plantar fascial fibromatosis: Secondary | ICD-10-CM

## 2022-07-04 DIAGNOSIS — M659 Synovitis and tenosynovitis, unspecified: Secondary | ICD-10-CM | POA: Diagnosis not present

## 2022-07-04 DIAGNOSIS — M778 Other enthesopathies, not elsewhere classified: Secondary | ICD-10-CM

## 2022-07-04 DIAGNOSIS — E1165 Type 2 diabetes mellitus with hyperglycemia: Secondary | ICD-10-CM | POA: Diagnosis not present

## 2022-07-04 MED ORDER — TRIAMCINOLONE ACETONIDE 40 MG/ML IJ SUSP
20.0000 mg | Freq: Once | INTRAMUSCULAR | Status: AC
Start: 2022-07-04 — End: 2022-07-04
  Administered 2022-07-04: 20 mg

## 2022-07-04 MED ORDER — DEXAMETHASONE SODIUM PHOSPHATE 120 MG/30ML IJ SOLN
2.0000 mg | Freq: Once | INTRAMUSCULAR | Status: AC
  Administered 2022-07-04: 2 mg via INTRA_ARTICULAR

## 2022-07-05 NOTE — Progress Notes (Signed)
She presents today for follow-up of her Planter fasciitis left and her capsulitis neuroma right foot.  States that she still having pain in both of these areas.  Objective: Vital signs are stable she alert oriented x 3 she has capsulitis synovitis third metatarsophalangeal joint right foot.  Left foot demonstrates pain to palpation medial calcaneal tubercle.  Assessment: Synovitis capsulitis third metatarsophalangeal joint right foot plan fasciitis chronic left foot.  Plan: Injected Kenalog with local anesthetic to the left foot today 20 mg.  The right foot around the second and third interdigital spaces and around the metatarsophalangeal joints with 10 mg of Kenalog.  Follow-up with her in October for a consult for the wintertime.

## 2022-08-15 DIAGNOSIS — E1165 Type 2 diabetes mellitus with hyperglycemia: Secondary | ICD-10-CM | POA: Diagnosis not present

## 2022-12-06 ENCOUNTER — Encounter: Payer: Self-pay | Admitting: Podiatry

## 2022-12-06 ENCOUNTER — Ambulatory Visit (INDEPENDENT_AMBULATORY_CARE_PROVIDER_SITE_OTHER): Payer: 59 | Admitting: Podiatry

## 2022-12-06 DIAGNOSIS — M19071 Primary osteoarthritis, right ankle and foot: Secondary | ICD-10-CM | POA: Diagnosis not present

## 2022-12-06 DIAGNOSIS — M722 Plantar fascial fibromatosis: Secondary | ICD-10-CM | POA: Diagnosis not present

## 2022-12-06 MED ORDER — TRIAMCINOLONE ACETONIDE 40 MG/ML IJ SUSP
60.0000 mg | Freq: Once | INTRAMUSCULAR | Status: AC
Start: 2022-12-06 — End: 2022-12-06
  Administered 2022-12-06: 60 mg

## 2022-12-06 NOTE — Progress Notes (Signed)
She presents today to discuss surgical intervention for the first metatarsophalangeal joint of the right foot.  States that her A1c is probably out of control at this point so she is going to wait until about February before surgical intervention.  She states that her heels are bothering her as well as the first metatarsal phalangeal joint of the right foot.  Objective: Pulses remain strong and palpable with severe pain around the first metatarsal phalangeal joint.  She has pain mostly in the first intermetatarsal space distally.  She also has pain to the plantar fascial calcaneal insertion site left greater than right on palpation.  Assessment: Pain in limb secondary to plantar fasciitis bilateral.  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  Plan: Discussed etiology pathology and surgical therapies she should be getting her A1c done in the next few weeks and then she will have another 1 before we do surgery.  Explained to her that I wanted the A1c below a 7.4.  I injected the bilateral heels today 20 mg Kenalog 5 mg and Marcaine.  Also injected 4 mg of dexamethasone to the distal aspect of the first intermetatarsal space right foot.

## 2023-02-14 DIAGNOSIS — H2513 Age-related nuclear cataract, bilateral: Secondary | ICD-10-CM | POA: Diagnosis not present

## 2023-02-14 DIAGNOSIS — H5213 Myopia, bilateral: Secondary | ICD-10-CM | POA: Diagnosis not present

## 2023-02-14 DIAGNOSIS — H52223 Regular astigmatism, bilateral: Secondary | ICD-10-CM | POA: Diagnosis not present

## 2023-02-14 DIAGNOSIS — E119 Type 2 diabetes mellitus without complications: Secondary | ICD-10-CM | POA: Diagnosis not present

## 2023-03-05 DIAGNOSIS — I10 Essential (primary) hypertension: Secondary | ICD-10-CM | POA: Diagnosis not present

## 2023-03-05 DIAGNOSIS — J45909 Unspecified asthma, uncomplicated: Secondary | ICD-10-CM | POA: Diagnosis not present

## 2023-03-05 DIAGNOSIS — E782 Mixed hyperlipidemia: Secondary | ICD-10-CM | POA: Diagnosis not present

## 2023-03-05 DIAGNOSIS — E559 Vitamin D deficiency, unspecified: Secondary | ICD-10-CM | POA: Diagnosis not present

## 2023-03-05 DIAGNOSIS — E1169 Type 2 diabetes mellitus with other specified complication: Secondary | ICD-10-CM | POA: Diagnosis not present

## 2023-03-14 ENCOUNTER — Ambulatory Visit (INDEPENDENT_AMBULATORY_CARE_PROVIDER_SITE_OTHER): Payer: 59

## 2023-03-14 ENCOUNTER — Encounter: Payer: Self-pay | Admitting: Podiatry

## 2023-03-14 ENCOUNTER — Ambulatory Visit (INDEPENDENT_AMBULATORY_CARE_PROVIDER_SITE_OTHER): Payer: 59 | Admitting: Podiatry

## 2023-03-14 DIAGNOSIS — M778 Other enthesopathies, not elsewhere classified: Secondary | ICD-10-CM

## 2023-03-14 DIAGNOSIS — M7751 Other enthesopathy of right foot: Secondary | ICD-10-CM

## 2023-03-14 DIAGNOSIS — M65971 Unspecified synovitis and tenosynovitis, right ankle and foot: Secondary | ICD-10-CM

## 2023-03-14 DIAGNOSIS — M65071 Abscess of tendon sheath, right ankle and foot: Secondary | ICD-10-CM | POA: Diagnosis not present

## 2023-03-14 NOTE — Progress Notes (Signed)
Subjective:  Patient ID: Stephanie Camacho, female    DOB: August 03, 1965,  MRN: [id number]  Chief Complaint  Patient presents with   Foot Pain    RM#11 Right foot pain no injury a lot of swelling has been getting injections that helped. Top of foot is where pain is located.    58 y.o. female presents with the above complaint.  Patient presents with right third metatarsophalangeal joint pain patient states there is a lot of swelling a lot of pain.  She usually gets injection by Dr. Al Corpus.  She would like to do another injection denies any other acute issues.   Review of Systems: Negative except as noted in the HPI. Denies N/V/F/Ch.  Past Medical History:  Diagnosis Date   Arthritis    Asthma    Diabetes mellitus without complication ([facility])    DVT, bilateral lower limbs ([facility])    post covid infection   Fibromyalgia    History of COVID-19 10/13/2019   [facility] ([facility] week stay)   Sleep apnea    no cpap    Current Outpatient Medications:    ACCU-CHEK AVIVA PLUS test strip, , Disp: , Rfl:    Accu-Chek FastClix Lancets MISC, , Disp: , Rfl:    Blood Glucose Monitoring Suppl (ACCU-CHEK GUIDE ME) w/Device KIT, USE UTD BID, Disp: , Rfl:    budesonide-formoterol (SYMBICORT) 80-4.5 MCG/ACT inhaler, Inhale 2 puffs into the lungs 2 (two) times daily., Disp: 1 each, Rfl: 3   DULOXETINE HCL PO, Take by mouth., Disp: , Rfl:    gabapentin (NEURONTIN) 100 MG capsule, Take 1 capsule (100 mg total) by mouth 2 (two) times daily., Disp: 60 capsule, Rfl: 3   HYDROcodone-acetaminophen (NORCO/VICODIN) 5-325 MG tablet, Take 1 tablet by mouth every 6 (six) hours as needed for moderate pain., Disp: 15 tablet, Rfl: 0   JARDIANCE 25 MG TABS tablet, , Disp: , Rfl:    LANTUS SOLOSTAR 100 UNIT/ML Solostar Pen, Inject 10 Units into the skin at bedtime., Disp: , Rfl:    meloxicam (MOBIC) 15 MG tablet, Take 1 tablet (15 mg total) by mouth daily., Disp: 30 tablet, Rfl: 3   montelukast (SINGULAIR) 10 MG  tablet, Take 1 tablet (10 mg total) by mouth at bedtime., Disp: 30 tablet, Rfl: 3   OZEMPIC, 0.25 OR 0.5 MG/DOSE, 2 MG/1.5ML SOPN, , Disp: , Rfl:    rosuvastatin (CRESTOR) 5 MG tablet, Take 5 mg by mouth daily., Disp: , Rfl:    VITAMIN D PO, Take by mouth., Disp: , Rfl:    meloxicam (MOBIC) 15 MG tablet, Take 1 tablet (15 mg total) by mouth daily., Disp: 30 tablet, Rfl: 0  Social History   Tobacco Use  Smoking Status Never  Smokeless Tobacco Never    Allergies  Allergen Reactions   Wound Dressing Adhesive     Other Reaction(s): Other (See Comments)  Bruising/ripping of skin--Please use paper tape   Objective:  There were no vitals filed for this visit. There is no height or weight on file to calculate BMI. Constitutional Well developed. Well nourished.  Vascular Dorsalis pedis pulses palpable bilaterally. Posterior tibial pulses palpable bilaterally. Capillary refill normal to all digits.  No cyanosis or clubbing noted. Pedal hair growth normal.  Neurologic Normal speech. Oriented to person, place, and time. Epicritic sensation to light touch grossly present bilaterally.  Dermatologic Nails well groomed and normal in appearance. No open wounds. No skin lesions.  Orthopedic: Right third metatarsophalangeal joint.  Pain with range of motion  of the joint no deep intra-articular pain noted no extensor or flexor tendinitis noted.   Radiographs: 3 views discussedMature adult right foot: No bony abnormalities identified.  Osteoarthritis noted of the first metatarsophalangeal joint.  Midfoot arthritis noted plantar and posterior heel spurring noted. Assessment:   1. Capsulitis of foot   2. Synovitis of right foot    Plan:  Patient was evaluated and treated and all questions answered.  Right third MTP synovitis -All questions and concerns were discussed with the patient in extensive detail given the amount of pain that she experiences she will benefit from steroid injection of  decrease confirmatory complaints of shoulder pain.  Patient agrees with plan to proceed with steroid injection -A steroid injection was performed at right third MTP using 1% plain Lidocaine and 10 mg of Kenalog. This was well tolerated.   No follow-ups on file.

## 2023-03-15 ENCOUNTER — Telehealth: Payer: Self-pay | Admitting: Podiatry

## 2023-03-15 MED ORDER — MELOXICAM 15 MG PO TABS
15.0000 mg | ORAL_TABLET | Freq: Every day | ORAL | 0 refills | Status: DC
Start: 2023-03-15 — End: 2023-07-26

## 2023-03-15 NOTE — Telephone Encounter (Signed)
Patient called stating she sw Dr Allena Katz yesterday and he was going to send over a RX for Meloxicam. Patient states it wasn't sent over. Please send to [facility] in [place]

## 2023-03-15 NOTE — Addendum Note (Signed)
Addended by: Nicholes Rough on: 03/15/2023 12:01 PM   Modules accepted: Orders

## 2023-05-28 DIAGNOSIS — E559 Vitamin D deficiency, unspecified: Secondary | ICD-10-CM | POA: Diagnosis not present

## 2023-05-28 DIAGNOSIS — E782 Mixed hyperlipidemia: Secondary | ICD-10-CM | POA: Diagnosis not present

## 2023-05-28 DIAGNOSIS — E1165 Type 2 diabetes mellitus with hyperglycemia: Secondary | ICD-10-CM | POA: Diagnosis not present

## 2023-05-28 DIAGNOSIS — J45909 Unspecified asthma, uncomplicated: Secondary | ICD-10-CM | POA: Diagnosis not present

## 2023-05-28 DIAGNOSIS — J301 Allergic rhinitis due to pollen: Secondary | ICD-10-CM | POA: Diagnosis not present

## 2023-06-07 ENCOUNTER — Telehealth: Payer: Self-pay

## 2023-06-07 NOTE — Progress Notes (Signed)
   06/07/2023  Patient ID: Stephanie Camacho, female   DOB: [birth year], 58 y.o.   MRN: [id number]  Contacted patient regarding referral for diabetes from quality report.. Patient on [facility] list. Patient was unable to talk at this time, requests a call back next week.   Appointment scheduled for 06/13/23 at 1:00 pm.  Harlon Flor, PharmD Clinical Pharmacist  [phone]

## 2023-06-13 NOTE — Progress Notes (Signed)
   06/13/2023  Patient ID: Stephanie Camacho, female   DOB: 05-Dec-1965, 58 y.o.   MRN: [id number]  Attempted to contact patient for scheduled appointment for medication management. Left HIPAA compliant message for patient to return my call at their convenience.    Livia Riffle, PharmD Clinical Pharmacist  [phone]

## 2023-06-24 DIAGNOSIS — E119 Type 2 diabetes mellitus without complications: Secondary | ICD-10-CM | POA: Diagnosis not present

## 2023-06-24 DIAGNOSIS — M1711 Unilateral primary osteoarthritis, right knee: Secondary | ICD-10-CM | POA: Diagnosis not present

## 2023-07-06 NOTE — Progress Notes (Signed)
   07/06/2023  Patient ID: Stephanie Camacho, female   DOB: 04-Aug-1965, 59 y.o.   MRN: [id number]  Attempted to contact patient for scheduled appointment for medication management. Unable to leave voicemail, will attempt to reach patient again in 7-10 days.   Livia Riffle, PharmD Clinical Pharmacist  [phone]

## 2023-07-11 NOTE — Progress Notes (Signed)
   07/11/2023  Patient ID: Stephanie Camacho, female   DOB: Feb 06, 1966, 58 y.o.   MRN: [id number]  Attempted to contact patient for scheduled appointment for medication management. Left HIPAA compliant message for patient to return my call at their convenience.   A third unsuccesful telephone outreach was attempted today to contact the patient who was referred to the pharmacy from a quality report for assistance with diabetes management. The Population Health team is pleased to engage with this patient at any time in the future should she be interested in assistance from the [facility] team.   Livia Riffle, PharmD Clinical Pharmacist  [phone]

## 2023-07-26 ENCOUNTER — Encounter: Payer: Self-pay | Admitting: Podiatry

## 2023-07-26 ENCOUNTER — Ambulatory Visit (INDEPENDENT_AMBULATORY_CARE_PROVIDER_SITE_OTHER): Admitting: Podiatry

## 2023-07-26 DIAGNOSIS — M19071 Primary osteoarthritis, right ankle and foot: Secondary | ICD-10-CM | POA: Diagnosis not present

## 2023-07-26 DIAGNOSIS — M722 Plantar fascial fibromatosis: Secondary | ICD-10-CM

## 2023-07-26 MED ORDER — MELOXICAM 15 MG PO TABS: 15.0000 mg | ORAL_TABLET | Freq: Every day | ORAL | 3 refills | Status: AC

## 2023-07-26 MED ORDER — TRIAMCINOLONE ACETONIDE 40 MG/ML IJ SUSP
60.0000 mg | Freq: Once | INTRAMUSCULAR | Status: AC
Start: 2023-07-26 — End: 2023-07-26
  Administered 2023-07-26: 60 mg

## 2023-07-26 NOTE — Progress Notes (Signed)
 She presents today for follow-up of her plantar fasciitis bilateral and capsulitis forefoot right.  Objective: Vital signs are stable oriented x 3.  Pulses are palpable.  There is no erythema edema salines drainage or odor severe pain to palpation medial calcaneal tubercles bilateral.  She also has pain on end range of motion of the 2nd and 3rd metatarsal phalangeal joints of the right foot.  Assessment: Capsulitis 2nd and 3rd phalangeal joints right.  plantar fasciitis bilateral.  plan: injected bilateral plantar fasciitis today 20 mg kenalog  5 mg marcaine  for maximal tenderness.  tolerated procedure well.  and injected around the 2nd and 3rd metatarsophalangeal joints today with 4 mg of dexamethasone  and local anesthetic.  tolerated procedure well follow-up with her in a month or so.

## 2023-08-30 ENCOUNTER — Ambulatory Visit: Admitting: Podiatry

## 2023-12-12 ENCOUNTER — Encounter: Payer: Self-pay | Admitting: *Deleted

## 2023-12-12 NOTE — Progress Notes (Signed)
 Stephanie Camacho                                          MRN: [id number]   12/12/2023   The [facility] Quality Team Specialist reviewed this patient medical record for the purposes of chart review for care gap closure. The following were reviewed: chart review for care gap closure-breast cancer screening, colorectal cancer screening, glycemic status assessment, and kidney health evaluation for diabetes:eGFR  and uACR.    [facility] Quality Team

## 2023-12-27 DIAGNOSIS — J069 Acute upper respiratory infection, unspecified: Secondary | ICD-10-CM | POA: Diagnosis not present

## 2023-12-27 DIAGNOSIS — R300 Dysuria: Secondary | ICD-10-CM | POA: Diagnosis not present

## 2024-01-23 ENCOUNTER — Encounter: Payer: Self-pay | Admitting: *Deleted

## 2024-01-23 NOTE — Progress Notes (Signed)
 Stephanie Camacho                                          MRN: [id number]   01/23/2024   The [facility] Quality Team Specialist reviewed this patient medical record for the purposes of chart review for care gap closure. The following were reviewed: chart review for care gap closure-breast cancer screening, colorectal cancer screening, glycemic status assessment, and kidney health evaluation for diabetes:eGFR  and uACR.    [facility] Quality Team

## 2024-01-29 NOTE — Progress Notes (Signed)
 Stephanie Camacho                                          MRN: [id number]   01/29/2024   The [facility] Quality Team Specialist reviewed this patient medical record for the purposes of chart review for care gap closure. The following were reviewed: chart review for care gap closure-breast cancer screening, colorectal cancer screening, glycemic status assessment, and kidney health evaluation for diabetes:eGFR  and uACR.    [facility] Quality Team

## 2024-02-05 ENCOUNTER — Telehealth: Payer: Self-pay | Admitting: Podiatry

## 2024-02-05 NOTE — Telephone Encounter (Signed)
 She would like another handicap card. Last seen 06/2023, does she need to come back in to be seen?

## 2024-03-03 NOTE — Progress Notes (Signed)
 Stephanie Camacho                                          MRN: [id number]   03/03/2024   The [facility] Quality Team Specialist reviewed this patient medical record for the purposes of chart review for care gap closure. The following were reviewed: chart review for care gap closure-glycemic status assessment.    [facility] Quality Team
